# Patient Record
Sex: Male | Born: 1965
Health system: Southern US, Community
[De-identification: ages and names within clinical notes are randomized; demographics above are authoritative.]

## PROBLEM LIST (undated history)

## (undated) DIAGNOSIS — R001 Bradycardia, unspecified: Secondary | ICD-10-CM

## (undated) DIAGNOSIS — I471 Supraventricular tachycardia, unspecified: Secondary | ICD-10-CM

## (undated) DIAGNOSIS — I10 Essential (primary) hypertension: Secondary | ICD-10-CM

## (undated) DIAGNOSIS — N182 Chronic kidney disease, stage 2 (mild): Secondary | ICD-10-CM

## (undated) DIAGNOSIS — T7840XA Allergy, unspecified, initial encounter: Secondary | ICD-10-CM

## (undated) DIAGNOSIS — K219 Gastro-esophageal reflux disease without esophagitis: Secondary | ICD-10-CM

## (undated) DIAGNOSIS — E785 Hyperlipidemia, unspecified: Secondary | ICD-10-CM

## (undated) DIAGNOSIS — H269 Unspecified cataract: Secondary | ICD-10-CM

## (undated) DIAGNOSIS — G4730 Sleep apnea, unspecified: Secondary | ICD-10-CM

## (undated) HISTORY — PX: APPENDECTOMY: SHX54

## (undated) HISTORY — DX: Unspecified cataract: H26.9

## (undated) HISTORY — DX: Supraventricular tachycardia, unspecified: I47.10

## (undated) HISTORY — DX: Hyperlipidemia, unspecified: E78.5

## (undated) HISTORY — DX: Sleep apnea, unspecified: G47.30

## (undated) HISTORY — DX: Gastro-esophageal reflux disease without esophagitis: K21.9

## (undated) HISTORY — DX: Bradycardia, unspecified: R00.1

## (undated) HISTORY — DX: Supraventricular tachycardia: I47.1

## (undated) HISTORY — DX: Allergy, unspecified, initial encounter: T78.40XA

## (undated) HISTORY — DX: Essential (primary) hypertension: I10

## (undated) HISTORY — DX: Chronic kidney disease, stage 2 (mild): N18.2

---

## 1998-08-04 ENCOUNTER — Encounter (INDEPENDENT_AMBULATORY_CARE_PROVIDER_SITE_OTHER): Payer: Self-pay | Admitting: Specialist

## 1998-08-04 ENCOUNTER — Observation Stay (HOSPITAL_COMMUNITY): Admission: EM | Admit: 1998-08-04 | Discharge: 1998-08-06 | Payer: Self-pay | Admitting: Emergency Medicine

## 1998-08-04 ENCOUNTER — Encounter: Payer: Self-pay | Admitting: Emergency Medicine

## 2000-07-14 ENCOUNTER — Ambulatory Visit (HOSPITAL_COMMUNITY): Admission: RE | Admit: 2000-07-14 | Discharge: 2000-07-14 | Payer: Self-pay | Admitting: Gastroenterology

## 2007-01-14 ENCOUNTER — Emergency Department (HOSPITAL_COMMUNITY): Admission: EM | Admit: 2007-01-14 | Discharge: 2007-01-15 | Payer: Self-pay | Admitting: Emergency Medicine

## 2010-06-27 NOTE — Procedures (Signed)
[place]  Patient:    Spencer Thomas, Spencer Thomas                         MRN: [id number] Proc. Date: 07/14/00 Adm. Date:  [id number] Attending:  Charna Elizabeth CC:         Velna Hatchet, M.D.   Procedure Report  DATE OF BIRTH:  March 26, 1955  REFERRING PHYSICIAN:  Velna Hatchet, M.D.  PROCEDURE PERFORMED:  Colonoscopy.  ENDOSCOPIST:  Anselmo Rod, M.D.  INSTRUMENT USED:  Olympus video colonoscope.  INDICATIONS FOR PROCEDURE:  The patient is a 45 year old African-American male with a history of rectal bleeding, rule out colonic polyps, masses, hemorrhoids, etc.  PREPROCEDURE PREPARATION:  Informed consent was procured from the patient. The patient was fasted for eight hours prior to the procedure and prepped with a bottle of magnesium citrate and a gallon of NuLytely the night prior to the procedure.  PREPROCEDURE PHYSICAL:  The patient had stable vital signs.  Neck supple. Chest clear to auscultation.  S1, S2 regular.  Abdomen soft with normal abdominal bowel sounds.  DESCRIPTION OF PROCEDURE:  The patient was placed in the left lateral decubitus position and sedated with 70 mg of Demerol and 7 mg of Versed intravenously.  Once the patient was adequately sedated and maintained on low-flow oxygen and continuous cardiac monitoring, the Olympus video colonoscope was advanced from the rectum to the cecum without difficulty.  No masses, polyps, erosions, ulcerations, or diverticula were seen.  A small amount of residual stool in the colon, small lesions could have been missed. The procedure was complete up to the cecum.  The ileocecal valve and the appendicular orifice were clearly visualized.  There was no evidence of hemorrhoids on retroflexion in the rectum.  IMPRESSION: 1. I suspect the patient have have had rectal bleeding from hard stool. 2. A high fiber diet has been recommended with plans for outpatient follow-up    in the next two  weeks. DD:  07/14/00 TD:  07/14/00 Job: [id number]

## 2014-07-06 DIAGNOSIS — G8929 Other chronic pain: Secondary | ICD-10-CM | POA: Insufficient documentation

## 2014-07-10 DIAGNOSIS — E559 Vitamin D deficiency, unspecified: Secondary | ICD-10-CM | POA: Insufficient documentation

## 2016-09-04 ENCOUNTER — Other Ambulatory Visit (INDEPENDENT_AMBULATORY_CARE_PROVIDER_SITE_OTHER): Payer: Self-pay | Admitting: Family

## 2016-09-04 MED ORDER — CEPHALEXIN 500 MG PO CAPS: 500.0000 mg | ORAL_CAPSULE | Freq: Three times a day (TID) | ORAL | 0 refills | Status: DC

## 2018-08-29 ENCOUNTER — Other Ambulatory Visit: Payer: Self-pay | Admitting: Family Medicine

## 2018-08-29 MED ORDER — TRIAMCINOLONE ACETONIDE 0.1 % EX CREA: 1.0000 "application " | TOPICAL_CREAM | Freq: Two times a day (BID) | CUTANEOUS | 0 refills | Status: DC

## 2019-11-15 DIAGNOSIS — I499 Cardiac arrhythmia, unspecified: Secondary | ICD-10-CM | POA: Insufficient documentation

## 2019-12-20 DIAGNOSIS — R030 Elevated blood-pressure reading, without diagnosis of hypertension: Secondary | ICD-10-CM | POA: Insufficient documentation

## 2019-12-20 DIAGNOSIS — R002 Palpitations: Secondary | ICD-10-CM | POA: Insufficient documentation

## 2019-12-20 DIAGNOSIS — I499 Cardiac arrhythmia, unspecified: Secondary | ICD-10-CM | POA: Insufficient documentation

## 2019-12-20 DIAGNOSIS — E785 Hyperlipidemia, unspecified: Secondary | ICD-10-CM | POA: Insufficient documentation

## 2020-02-04 ENCOUNTER — Other Ambulatory Visit: Payer: Self-pay

## 2020-02-04 ENCOUNTER — Encounter (HOSPITAL_BASED_OUTPATIENT_CLINIC_OR_DEPARTMENT_OTHER): Payer: Self-pay | Admitting: Emergency Medicine

## 2020-02-04 ENCOUNTER — Emergency Department (HOSPITAL_BASED_OUTPATIENT_CLINIC_OR_DEPARTMENT_OTHER)
Admission: EM | Admit: 2020-02-04 | Discharge: 2020-02-04 | Disposition: A | Discharge status: Home / Self Care | Payer: Self-pay | Attending: Emergency Medicine | Admitting: Emergency Medicine

## 2020-02-04 ENCOUNTER — Emergency Department (HOSPITAL_BASED_OUTPATIENT_CLINIC_OR_DEPARTMENT_OTHER): Payer: Self-pay

## 2020-02-04 DIAGNOSIS — R0789 Other chest pain: Secondary | ICD-10-CM | POA: Insufficient documentation

## 2020-02-04 DIAGNOSIS — R079 Chest pain, unspecified: Secondary | ICD-10-CM

## 2020-02-04 LAB — CBC
HCT: 46.3 % (ref 39.0–52.0)
Hemoglobin: 16.2 g/dL (ref 13.0–17.0)
MCH: 29.9 pg (ref 26.0–34.0)
MCHC: 35 g/dL (ref 30.0–36.0)
MCV: 85.6 fL (ref 80.0–100.0)
Platelets: 230 10*3/uL (ref 150–400)
RBC: 5.41 MIL/uL (ref 4.22–5.81)
RDW: 13.6 % (ref 11.5–15.5)
WBC: 4.7 10*3/uL (ref 4.0–10.5)
nRBC: 0 % (ref 0.0–0.2)

## 2020-02-04 LAB — BASIC METABOLIC PANEL
Anion gap: 9 (ref 5–15)
BUN: 10 mg/dL (ref 6–20)
CO2: 24 mmol/L (ref 22–32)
Calcium: 9.3 mg/dL (ref 8.9–10.3)
Chloride: 106 mmol/L (ref 98–111)
Creatinine, Ser: 1.3 mg/dL — ABNORMAL HIGH (ref 0.61–1.24)
GFR, Estimated: >60 mL/min (ref >60)
Glucose, Bld: 100 mg/dL — ABNORMAL HIGH (ref 70–99)
Potassium: 3.8 mmol/L (ref 3.5–5.1)
Sodium: 139 mmol/L (ref 135–145)

## 2020-02-04 LAB — TROPONIN I (HIGH SENSITIVITY)
Troponin I (High Sensitivity): 3 ng/L (ref <18)
Troponin I (High Sensitivity): 3 ng/L (ref <18)

## 2020-02-04 MED ORDER — PANTOPRAZOLE SODIUM 20 MG PO TBEC: 20.0000 mg | DELAYED_RELEASE_TABLET | Freq: Two times a day (BID) | ORAL | 0 refills | Status: DC

## 2020-02-04 MED ORDER — NAPROXEN 375 MG PO TABS: 375.0000 mg | ORAL_TABLET | Freq: Two times a day (BID) | ORAL | 0 refills | Status: DC

## 2020-02-04 NOTE — ED Provider Notes (Signed)
[facility] EMERGENCY DEPARTMENT Provider Note   CSN: [id number] Arrival date & time: 02/04/20  1126     History Chief Complaint  Patient presents with  . Chest Pain    Spencer Thomas is a 54 y.o. male.  HPI  HPI: A 54 year old patient with a history of hypercholesterolemia presents for evaluation of chest pain. Initial onset of pain was more than 6 hours ago. The patient's chest pain is described as heaviness/pressure/tightness and is not worse with exertion. The patient's chest pain is middle- or left-sided, is not well-localized, is not sharp and does radiate to the arms/jaw/neck. The patient does not complain of nausea and denies diaphoresis. The patient has no history of stroke, has no history of peripheral artery disease, has not smoked in the past 90 days, denies any history of treated diabetes, has no relevant family history of coronary artery disease (first degree relative at less than age 52), is not hypertensive and does not have an elevated BMI (>=30).   History reviewed. No pertinent past medical history.  There are no problems to display for this patient.   Past Surgical History:  Procedure Laterality Date  . APPENDECTOMY         No family history on file.  Social History   Tobacco Use  . Smoking status: Never Smoker  . Smokeless tobacco: Never Used  Vaping Use  . Vaping Use: Never used  Substance Use Topics  . Alcohol use: Not Currently  . Drug use: Never    Home Medications Prior to Admission medications   Medication Sig Start Date End Date Taking? Authorizing Provider  cephALEXin (KEFLEX) 500 MG capsule Take 1 capsule (500 mg total) by mouth 3 (three) times daily. 09/04/16   Adonis Huguenin, NP  naproxen (NAPROSYN) 375 MG tablet Take 1 tablet (375 mg total) by mouth 2 (two) times daily. 02/04/20   Linwood Dibbles, MD  pantoprazole (PROTONIX) 20 MG tablet Take 1 tablet (20 mg total) by mouth 2 (two) times daily. 02/04/20   Linwood Dibbles, MD   triamcinolone cream (KENALOG) 0.1 % Apply 1 application topically 2 (two) times daily. 08/29/18   Hilts, Casimiro Needle, MD    Allergies    Patient has no known allergies.  Review of Systems   Review of Systems  All other systems reviewed and are negative.   Physical Exam Updated Vital Signs BP (!) 127/99 (BP Location: Right Arm)   Pulse 69   Temp (!) 97.3 F (36.3 C) (Oral)   Resp 16   Ht 1.791 m (5' 10.5")   Wt 117.9 kg   SpO2 100%   BMI 36.78 kg/m   Physical Exam Vitals and nursing note reviewed.  Constitutional:      General: He is not in acute distress.    Appearance: He is well-developed and well-nourished.  HENT:     Head: Normocephalic and atraumatic.     Right Ear: External ear normal.     Left Ear: External ear normal.  Eyes:     General: No scleral icterus.       Right eye: No discharge.        Left eye: No discharge.     Conjunctiva/sclera: Conjunctivae normal.  Neck:     Trachea: No tracheal deviation.  Cardiovascular:     Rate and Rhythm: Normal rate and regular rhythm.     Pulses: Intact distal pulses.  Pulmonary:     Effort: Pulmonary effort is normal. No respiratory distress.  Breath sounds: Normal breath sounds. No stridor. No wheezing or rales.  Abdominal:     General: Bowel sounds are normal. There is no distension.     Palpations: Abdomen is soft.     Tenderness: There is no abdominal tenderness. There is no guarding or rebound.  Musculoskeletal:        General: No tenderness or edema.     Cervical back: Neck supple.  Skin:    General: Skin is warm and dry.     Findings: No rash.  Neurological:     Mental Status: He is alert.     Cranial Nerves: No cranial nerve deficit (no facial droop, extraocular movements intact, no slurred speech).     Sensory: No sensory deficit.     Motor: No abnormal muscle tone or seizure activity.     Coordination: Coordination normal.     Deep Tendon Reflexes: Strength normal.  Psychiatric:        Mood and  Affect: Mood and affect normal.     ED Results / Procedures / Treatments   Labs (all labs ordered are listed, but only abnormal results are displayed) Labs Reviewed  BASIC METABOLIC PANEL - Abnormal; Notable for the following components:      Result Value   Glucose, Bld 100 (*)    Creatinine, Ser 1.30 (*)    All other components within normal limits  CBC  TROPONIN I (HIGH SENSITIVITY)  TROPONIN I (HIGH SENSITIVITY)    EKG EKG Interpretation  Date/Time:  Sunday February 04 2020 11:25:37 EST Ventricular Rate:  80 PR Interval:  174 QRS Duration: 104 QT Interval:  392 QTC Calculation: 452 R Axis:   -23 Text Interpretation: Normal sinus rhythm Minimal voltage criteria for LVH, may be normal variant ( Cornell product ) Borderline ECG No old tracing to compare Confirmed by Linwood Dibbles [phone]) on 02/04/2020 11:46:55 AM   Radiology DG Chest 2 View  Result Date: 02/04/2020 CLINICAL DATA:  Chest pain EXAM: CHEST - 2 VIEW COMPARISON:  None FINDINGS: Normal heart size, mediastinal contours, and pulmonary vascularity. Lungs clear. No pleural effusion or pneumothorax. Bones unremarkable. IMPRESSION: Normal exam. Electronically Signed   By: Ulyses Southward M.D.   On: 02/04/2020 11:51    Procedures Procedures (including critical care time)  Medications Ordered in ED Medications - No data to display  ED Course  I have reviewed the triage vital signs and the nursing notes.  Pertinent labs & imaging results that were available during my care of the patient were reviewed by me and considered in my medical decision making (see chart for details).    MDM Rules/Calculators/A&P HEAR Score: 4                        Patient presented to ED for evaluation of chest pain.  Symptoms constant and ongoing since yesterday.  Moderate risk heart score.  Serial troponins normal.  I doubt symptoms related to acute coronary syndrome.  Patient without shortness of breath.  No tachycardia.  Doubt  PE.  Etiology of symptoms unclear.  Will try course of antacids as this could be related to acid reflux.  Discussed outpatient follow-up with PCP.  Warning signs precautions discussed. Final Clinical Impression(s) / ED Diagnoses Final diagnoses:  Chest pain, unspecified type    Rx / DC Orders ED Discharge Orders         Ordered    pantoprazole (PROTONIX) 20 MG tablet  2 times daily  02/04/20 1540    naproxen (NAPROSYN) 375 MG tablet  2 times daily        02/04/20 1540           Linwood Dibbles, MD 02/04/20 1541

## 2020-02-04 NOTE — Discharge Instructions (Addendum)
Take the medications as prescribed.  Follow-up with your primary care doctor to be rechecked as we discussed.  Return as needed for worsening symptoms.

## 2020-02-04 NOTE — ED Notes (Signed)
Yesterday began having chest pain, mid chest, non-radiating, denies nausea or vomiting, states he awoke with mid chest pain, considered it being muscular pain. States he does not feel like he has had any shortness of breath, contributes that to wearing a mask

## 2020-02-04 NOTE — ED Triage Notes (Signed)
Pt c/o non-radiating left sided chest pain onset yesterday.

## 2020-02-04 NOTE — ED Notes (Signed)
ED Provider at bedside. 

## 2020-03-01 ENCOUNTER — Encounter (HOSPITAL_COMMUNITY): Payer: Self-pay | Admitting: Emergency Medicine

## 2020-03-01 ENCOUNTER — Emergency Department (HOSPITAL_COMMUNITY): Payer: 59

## 2020-03-01 ENCOUNTER — Other Ambulatory Visit: Payer: Self-pay

## 2020-03-01 ENCOUNTER — Inpatient Hospital Stay (HOSPITAL_COMMUNITY)
Admission: EM | Admit: 2020-03-01 | Discharge: 2020-03-04 | DRG: 287 | Disposition: A | Discharge status: Home / Self Care | Payer: 59 | Attending: Internal Medicine | Admitting: Internal Medicine

## 2020-03-01 DIAGNOSIS — Z20822 Contact with and (suspected) exposure to covid-19: Secondary | ICD-10-CM | POA: Diagnosis not present

## 2020-03-01 DIAGNOSIS — E669 Obesity, unspecified: Secondary | ICD-10-CM | POA: Diagnosis present

## 2020-03-01 DIAGNOSIS — I208 Other forms of angina pectoris: Secondary | ICD-10-CM | POA: Diagnosis not present

## 2020-03-01 DIAGNOSIS — I209 Angina pectoris, unspecified: Secondary | ICD-10-CM | POA: Diagnosis not present

## 2020-03-01 DIAGNOSIS — Z7982 Long term (current) use of aspirin: Secondary | ICD-10-CM

## 2020-03-01 DIAGNOSIS — Z79899 Other long term (current) drug therapy: Secondary | ICD-10-CM

## 2020-03-01 DIAGNOSIS — I471 Supraventricular tachycardia: Secondary | ICD-10-CM | POA: Diagnosis not present

## 2020-03-01 DIAGNOSIS — I10 Essential (primary) hypertension: Secondary | ICD-10-CM

## 2020-03-01 DIAGNOSIS — Z6837 Body mass index (BMI) 37.0-37.9, adult: Secondary | ICD-10-CM | POA: Diagnosis not present

## 2020-03-01 DIAGNOSIS — R079 Chest pain, unspecified: Secondary | ICD-10-CM | POA: Diagnosis present

## 2020-03-01 DIAGNOSIS — R0789 Other chest pain: Secondary | ICD-10-CM | POA: Diagnosis not present

## 2020-03-01 DIAGNOSIS — E785 Hyperlipidemia, unspecified: Secondary | ICD-10-CM | POA: Diagnosis not present

## 2020-03-01 DIAGNOSIS — R9431 Abnormal electrocardiogram [ECG] [EKG]: Secondary | ICD-10-CM

## 2020-03-01 DIAGNOSIS — E7800 Pure hypercholesterolemia, unspecified: Secondary | ICD-10-CM | POA: Diagnosis not present

## 2020-03-01 LAB — TROPONIN I (HIGH SENSITIVITY)
Troponin I (High Sensitivity): 3 ng/L (ref <18)
Troponin I (High Sensitivity): 3 ng/L (ref <18)

## 2020-03-01 LAB — CBC
HCT: 47.3 % (ref 39.0–52.0)
Hemoglobin: 15.8 g/dL (ref 13.0–17.0)
MCH: 29.2 pg (ref 26.0–34.0)
MCHC: 33.4 g/dL (ref 30.0–36.0)
MCV: 87.3 fL (ref 80.0–100.0)
Platelets: 212 10*3/uL (ref 150–400)
RBC: 5.42 MIL/uL (ref 4.22–5.81)
RDW: 13.5 % (ref 11.5–15.5)
WBC: 5.8 10*3/uL (ref 4.0–10.5)
nRBC: 0 % (ref 0.0–0.2)

## 2020-03-01 LAB — BASIC METABOLIC PANEL
Anion gap: 12 (ref 5–15)
BUN: 16 mg/dL (ref 6–20)
CO2: 26 mmol/L (ref 22–32)
Calcium: 9.1 mg/dL (ref 8.9–10.3)
Chloride: 103 mmol/L (ref 98–111)
Creatinine, Ser: 1.23 mg/dL (ref 0.61–1.24)
GFR, Estimated: >60 mL/min (ref >60)
Glucose, Bld: 95 mg/dL (ref 70–99)
Potassium: 4.5 mmol/L (ref 3.5–5.1)
Sodium: 141 mmol/L (ref 135–145)

## 2020-03-01 MED ORDER — ONDANSETRON HCL 4 MG/2ML IJ SOLN: 4.0000 mg | Freq: Four times a day (QID) | INTRAMUSCULAR | Status: DC | PRN

## 2020-03-01 MED ORDER — NITROGLYCERIN 0.4 MG SL SUBL: 0.4000 mg | SUBLINGUAL_TABLET | SUBLINGUAL | Status: DC | PRN

## 2020-03-01 MED ORDER — HEPARIN BOLUS VIA INFUSION
4000.0000 [IU] | Freq: Once | INTRAVENOUS | Status: AC
  Administered 2020-03-01: 4000 [IU] via INTRAVENOUS
  Filled 2020-03-01: qty 4000

## 2020-03-01 MED ORDER — ATORVASTATIN CALCIUM 80 MG PO TABS
80.0000 mg | ORAL_TABLET | Freq: Every day | ORAL | Status: DC
  Administered 2020-03-02 – 2020-03-03 (×2): 80 mg via ORAL
  Filled 2020-03-01 (×2): qty 1

## 2020-03-01 MED ORDER — NITROGLYCERIN 0.4 MG SL SUBL
0.4000 mg | SUBLINGUAL_TABLET | SUBLINGUAL | Status: DC | PRN
  Administered 2020-03-01: 0.4 mg via SUBLINGUAL
  Filled 2020-03-01: qty 1

## 2020-03-01 MED ORDER — METOPROLOL TARTRATE 12.5 MG HALF TABLET
12.5000 mg | ORAL_TABLET | Freq: Two times a day (BID) | ORAL | Status: DC
  Administered 2020-03-02 – 2020-03-04 (×3): 12.5 mg via ORAL
  Filled 2020-03-01 (×6): qty 1

## 2020-03-01 MED ORDER — ASPIRIN EC 81 MG PO TBEC
81.0000 mg | DELAYED_RELEASE_TABLET | Freq: Every day | ORAL | Status: DC
  Administered 2020-03-02 – 2020-03-04 (×3): 81 mg via ORAL
  Filled 2020-03-01 (×3): qty 1

## 2020-03-01 MED ORDER — ACETAMINOPHEN 325 MG PO TABS: 650.0000 mg | ORAL_TABLET | ORAL | Status: DC | PRN

## 2020-03-01 MED ORDER — LOSARTAN POTASSIUM 25 MG PO TABS
12.5000 mg | ORAL_TABLET | Freq: Every day | ORAL | Status: DC
  Administered 2020-03-02: 12.5 mg via ORAL
  Filled 2020-03-01: qty 1

## 2020-03-01 MED ORDER — HEPARIN (PORCINE) 25000 UT/250ML-% IV SOLN
1600.0000 [IU]/h | INTRAVENOUS | Status: DC
  Administered 2020-03-01: 23:00:00 1200 [IU]/h via INTRAVENOUS
  Administered 2020-03-02 – 2020-03-03 (×2): 1300 [IU]/h via INTRAVENOUS
  Administered 2020-03-04: 1600 [IU]/h via INTRAVENOUS
  Filled 2020-03-01 (×6): qty 250

## 2020-03-01 NOTE — H&P (Signed)
Cardiology History & Physical    Patient ID: TANYON ALIPIO MRN: [id number], DOB/AGE: 07/04/1965   Admit date: 03/01/2020  Primary Physician: Maudie Flakes, FNP Primary Cardiologist: No primary care provider on file.  Patient Profile    55 year old male with history of hypertension and obesity who presents for chest pain.  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  He describes central and left-sided chest pain that has a stabbing-like sensation, but also some aspects of pressure-like sensation that radiates down his arm and his worsened by any sort of exertion.  He says that even sitting up will make it worse.  Ambulating certainly makes it worse.  Initially had chest pain that was about 8 out of 10 and received nitroglycerin which improved to 2 out of 10.  EMS administered aspirin in route.  At this point he is relatively chest pain-free but will have some chest pain with any sort of maneuvering or positional changes.  He cannot identify specific movement that worsens the pain and he does not feel like it is musculoskeletal.  Sitting up does not help it but rather worsens it.  Lying down does not worsen it.  It is not clearly related to respiration.  He has no prior cardiovascular history.  Serial troponin 3 and 4:03 hours in the waiting room.  ECG reviewed with some lateral ST segment elevation, not meeting criteria, with nonspecific ST changes and abnormal axis.  This is apparently similar to an ECG that was obtained earlier this year on review of care everywhere records.  Given a similar episode earlier this year you have been referred to an outpatient cardiologist appointment and with the [facility] syste and recommended for  a stress echocardiogram, but he did not complete this because he did not transition to [facility] and was out of insurance for a bit.  He did okay until around December at the holidays when he had a very similar episode that resolved uneventfully but was not nearly as intense in terms of the pain.  He thinks that he has been having some shortness of breath just doing his normal activities of work in the intervening time between Christmas and now.  No orthopnea, PND.  Occasional fluttering in the chest and palpitations.  Past Medical History   History reviewed. No pertinent past medical history.  Past Surgical History:  Procedure Laterality Date   APPENDECTOMY       Allergies No Known Allergies  Home Medications    Prior to Admission medications   Medication Sig Start Date End Date Taking? Authorizing Provider  cephALEXin (KEFLEX) 500 MG capsule Take 1 capsule (500 mg total) by mouth 3 (three) times daily. 09/04/16   Adonis Huguenin, NP  naproxen (NAPROSYN) 375 MG tablet Take 1 tablet (375 mg total) by mouth 2 (two) times daily. 02/04/20   Linwood Dibbles, MD  pantoprazole (PROTONIX) 20 MG tablet Take 1 tablet (20 mg total) by mouth 2 (two) times daily. 02/04/20   Linwood Dibbles, MD  triamcinolone cream (KENALOG) 0.1 % Apply 1 application topically 2 (two) times daily. 08/29/18   Hilts, Casimiro Needle, MD    Family History    Mother and brother with diabetes  but no significant family history of premature ASCVD.  Social History    Non-smoker, rare alcohol use, no sympathomimetic abuse.  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  Exercise pretty regularly prior to the pandemic but has slacked off at this point.  Review of Systems    General:  No chills, fever, night sweats or weight changes.  Cardiovascular:  No chest pain, dyspnea on exertion, edema, orthopnea, palpitations, paroxysmal nocturnal dyspnea. Dermatological: No rash, lesions/masses Respiratory: No cough,  dyspnea Urologic: No hematuria, dysuria Abdominal:   No nausea, vomiting, diarrhea, bright red blood per rectum, melena, or hematemesis Neurologic:  No visual changes, wkns, changes in mental status. All other systems reviewed and are otherwise negative except as noted above.  Physical Exam    BP 126/85    Pulse (!) 59    Temp 98.1 F (36.7 C) (Oral)    Resp 18    Ht 5' 10.5" (1.791 m)    Wt 120.2 kg    SpO2 95%    BMI 37.49 kg/m  General: Alert, NAD HEENT: Normal  Neck: No bruits or JVD. Lungs:  Resp regular and unlabored, CTA bilaterally. Heart: Regular rhythm, no s3, s4, or murmurs. Abdomen: Soft, non-tender, non-distended, BS +.  Extremities: Warm. No clubbing, cyanosis or edema. DP/PT/Radials 2+ and equal bilaterally. Psych: Normal affect. Neuro: Alert and oriented. No gross focal deficits. No abnormal movements.  Labs  I have reviewed all the objective data personally, summarized as below.  Chemistries unremarkable High-sensitivity troponin 3, 3 CBC unremarkable   Radiology Studies    DG Chest 2 View  Result Date: 03/01/2020 CLINICAL DATA:  Chest pain. EXAM: CHEST - 2 VIEW COMPARISON:  February 04, 2020. FINDINGS: The heart size and mediastinal contours are within normal limits. Both lungs are clear. No pneumothorax or pleural effusion is noted. The visualized skeletal structures are unremarkable. IMPRESSION: No active cardiopulmonary disease. Electronically Signed   By: Lupita Raider M.D.   On: 03/01/2020 16:42   DG Chest 2 View  Result Date: 02/04/2020 CLINICAL DATA:  Chest pain EXAM: CHEST - 2 VIEW COMPARISON:  None FINDINGS: Normal heart size, mediastinal contours, and pulmonary vascularity. Lungs clear. No pleural effusion or pneumothorax. Bones unremarkable. IMPRESSION: Normal exam. Electronically Signed   By: Ulyses Southward M.D.   On: 02/04/2020 11:51    ECG & Cardiac Imaging    ECG personally reviewed.  Borderline LVH by aVL criteria.  Repolarization  abnormalities in the lateral leads.  Poor R wave progression with nonspecific ST changes across the precordium.  Assessment & Plan    54 year old male with a history of hypertension, untreated, and obesity who presents with chest pain.  The chest pain has some typical features including worsening with exertion and improvement with rest with some response to nitroglycerin.  Given persistent symptoms and abnormal ECG I am concerned for an unstable angina picture.  Additionally, it sounds like he has had some worsening symptoms over the last month with a similar episode at the end of December and some subsequent exertional shortness of breath.  ECG has some repolarization abnormalities that are nonspecific.  I discussed with him that it is a bit abnormal to be having a true acute coronary syndrome with negative high-sensitivity troponin, but he agreed that he would prefer to be on the safe side with a more aggressive treatment course, and I think this is reasonable given his age and risk factors.  Problem list Chest pain  Abnormal EKG Hypertension Obesity  Plan #Chest pain #Abnormal EKG -Aspirin 81 mg -Heparin infusion, ACS nomogram -High intensity statin -Restratification labs in the morning -Pending clinical course and test availability, CTA versus invasive angiography this admission -Nitroglycerin as needed  #Hypertension -Start losartan 12.5 mg daily  #Obesity -Lifestyle modification discussed.  Nutrition: Regular diet DVT ppx: Therapeutic anticoagulation GI ppx: Not indicated Advanced Care Planning: Full code   Signed, Regino Schultze, MD 03/01/2020, 10:32 PM

## 2020-03-01 NOTE — ED Notes (Signed)
Admitting at bedside 

## 2020-03-01 NOTE — ED Triage Notes (Signed)
Patient BIB [facility] for chest pressure that started while at work. Reported pain initially was 10/10, decreased to 4/10 without intervention. Patient was concerned because he was told to see a cardiologist but has not been able to see one yet.  324mg  aspirin  0.4 mg 1x SL NTG 20g saline lock in left [facility]

## 2020-03-01 NOTE — ED Notes (Signed)
Pt ambulatory to bathroom, steady gait

## 2020-03-01 NOTE — ED Provider Notes (Signed)
[facility] EMERGENCY DEPARTMENT Provider Note   CSN: [id number] Arrival date & time: 03/01/20  1610     History Chief Complaint  Patient presents with  . Chest Pain    CAPRI RABEN is a 55 y.o. male.  The history is provided by the patient.  Chest Pain Pain location:  L chest Pain quality: aching, pressure and tightness   Pain radiates to:  L jaw, L shoulder and L arm Pain severity:  Moderate Onset quality:  Sudden Duration: severe pain lasted about but now has been intermittent dull pain since. Timing:  Intermittent Progression:  Waxing and waning Chronicity: 1 prior episode 12/26. Context comment:  Started while working today but minimal exertion Relieved by: significant improvement with ASA and NTG. Worsened by:  Exertion and certain positions Ineffective treatments:  None tried Associated symptoms: no abdominal pain, no back pain, no cough, no diaphoresis, no dizziness, no fever, no headache, no heartburn, no lower extremity edema, no nausea, no near-syncope, no shortness of breath, no syncope, no vomiting and no weakness   Risk factors: male sex   Risk factors: no coronary artery disease, no diabetes mellitus, no prior DVT/PE and no smoking   Risk factors comment:  Hx of untreated HTN, HLD.  no family hx of CAD or PE/DVT      History reviewed. No pertinent past medical history.  There are no problems to display for this patient.   Past Surgical History:  Procedure Laterality Date  . APPENDECTOMY         No family history on file.  Social History   Tobacco Use  . Smoking status: Never Smoker  . Smokeless tobacco: Never Used  Vaping Use  . Vaping Use: Never used  Substance Use Topics  . Alcohol use: Not Currently  . Drug use: Never    Home Medications Prior to Admission medications   Medication Sig Start Date End Date Taking? Authorizing Provider  cephALEXin (KEFLEX) 500 MG capsule Take 1 capsule (500 mg total) by mouth 3  (three) times daily. 09/04/16   Adonis Huguenin, NP  naproxen (NAPROSYN) 375 MG tablet Take 1 tablet (375 mg total) by mouth 2 (two) times daily. 02/04/20   Linwood Dibbles, MD  pantoprazole (PROTONIX) 20 MG tablet Take 1 tablet (20 mg total) by mouth 2 (two) times daily. 02/04/20   Linwood Dibbles, MD  triamcinolone cream (KENALOG) 0.1 % Apply 1 application topically 2 (two) times daily. 08/29/18   Hilts, Casimiro Needle, MD    Allergies    Patient has no known allergies.  Review of Systems   Review of Systems  Constitutional: Negative for diaphoresis and fever.  Respiratory: Negative for cough and shortness of breath.   Cardiovascular: Positive for chest pain. Negative for syncope and near-syncope.  Gastrointestinal: Negative for abdominal pain, heartburn, nausea and vomiting.  Musculoskeletal: Negative for back pain.  Neurological: Negative for dizziness, weakness and headaches.  All other systems reviewed and are negative.   Physical Exam Updated Vital Signs BP (!) 153/96   Pulse 66   Temp 98.1 F (36.7 C) (Oral)   Resp (!) 24   Ht 5' 10.5" (1.791 m)   Wt 120.2 kg   SpO2 100%   BMI 37.49 kg/m   Physical Exam Vitals and nursing note reviewed.  Constitutional:      General: He is not in acute distress.    Appearance: He is well-developed and well-nourished.  HENT:     Head: Normocephalic and  atraumatic.     Mouth/Throat:     Mouth: Oropharynx is clear and moist.  Eyes:     Extraocular Movements: EOM normal.     Conjunctiva/sclera: Conjunctivae normal.     Pupils: Pupils are equal, round, and reactive to light.  Cardiovascular:     Rate and Rhythm: Normal rate and regular rhythm.     Pulses: Normal pulses and intact distal pulses.     Heart sounds: Normal heart sounds. No murmur heard.   Pulmonary:     Effort: Pulmonary effort is normal. No respiratory distress.     Breath sounds: Normal breath sounds. No wheezing or rales.     Comments: Pain reproduced with pt sitting up and  moving around in the bed but not with palpation Chest:     Chest wall: No tenderness.  Abdominal:     General: There is no distension.     Palpations: Abdomen is soft.     Tenderness: There is no abdominal tenderness. There is no guarding or rebound.  Musculoskeletal:        General: No tenderness or edema. Normal range of motion.     Cervical back: Normal range of motion and neck supple.     Right lower leg: No edema.     Left lower leg: No edema.  Skin:    General: Skin is warm and dry.     Findings: No erythema or rash.  Neurological:     General: No focal deficit present.     Mental Status: He is alert and oriented to person, place, and time. Mental status is at baseline.  Psychiatric:        Mood and Affect: Mood and affect and mood normal.        Behavior: Behavior normal.        Thought Content: Thought content normal.     ED Results / Procedures / Treatments   Labs (all labs ordered are listed, but only abnormal results are displayed) Labs Reviewed  BASIC METABOLIC PANEL  CBC  TROPONIN I (HIGH SENSITIVITY)  TROPONIN I (HIGH SENSITIVITY)    EKG EKG Interpretation  Date/Time:  Friday March 01 2020 16:08:02 EST Ventricular Rate:  84 PR Interval:  168 QRS Duration: 92 QT Interval:  392 QTC Calculation: 463 R Axis:   -17 Text Interpretation: Normal sinus rhythm Minimal voltage criteria for LVH, may be normal variant ( R in aVL ) Nonspecific T wave abnormality new Prolonged QT Confirmed by Gwyneth Sprout ([id number]) on 03/01/2020 7:38:22 PM   Radiology DG Chest 2 View  Result Date: 03/01/2020 CLINICAL DATA:  Chest pain. EXAM: CHEST - 2 VIEW COMPARISON:  February 04, 2020. FINDINGS: The heart size and mediastinal contours are within normal limits. Both lungs are clear. No pneumothorax or pleural effusion is noted. The visualized skeletal structures are unremarkable. IMPRESSION: No active cardiopulmonary disease. Electronically Signed   By: Lupita Raider M.D.    On: 03/01/2020 16:42    Procedures Procedures (including critical care time)  Medications Ordered in ED Medications  nitroGLYCERIN (NITROSTAT) SL tablet 0.4 mg (has no administration in time range)    ED Course  I have reviewed the triage vital signs and the nursing notes.  Pertinent labs & imaging results that were available during my care of the patient were reviewed by me and considered in my medical decision making (see chart for details).    MDM Rules/Calculators/A&P  Patient is a 55 year old male presenting today with chest pain that started today while he was at work.  He reports the severe pain lasted for approximately 30 minutes and almost brought him to his knees with radiation into his arm and jaw.  It improved after he received aspirin and nitroglycerin and has only been a very dull pain approximately a 2 out of 10 but he notices it does get worse anytime he does anything including changing his close or walk.  He denies any associated shortness of breath, nausea, near syncope.  He has no abdominal pain and reports symptoms did not start directly after eating.  Patient was seen in the emergency room on 02/04/2020 for chest pain that had been present for approximately 24 hours.  He reports the pain today was more severe but was in the same location as last time.  At that time patient had a nonspecific EKG and negative troponins.  He was discharged home and had plan on following up with cardiology but because of a recent job change he was waiting for his insurance before he scheduled an appointment.  Patient did see a cardiologist in November 2021 at [facility].  He saw them because he had an abnormal EKG.  Based on the cardiologist note he had untreated hyperlipidemia and hypertension.  He was supposed to be keeping a journal of his blood pressures and start on a lipid lowering medication.  Patient has not been doing that and did not start taking the cholesterol  medication.  He has been taking a baby aspirin daily and the cardiologist wanted to do an exercise stress test as well as Holter monitor placement because he was having PACs.  Patient did not have any of those test done.  Patient is a heart score of 6 which does put him at a moderate risk.  He is still complaining of some pain we will give an additional nitroglycerin.  His troponins are normal and BMP and CBC are within normal limit.  EKG does show ST depression inferiorly which is unchanged from prior EKGs.  Low suspicion for PE, dissection or infectious etiology.  Seems less likely to be GERD as his symptoms did not occur directly after eating he has no abdominal pain and is not made worse by lying down.  We will repeat an EKG check patient after a second nitroglycerin and discussed with cardiology.  9:18 PM Repeat EKG here shows some mild ST elevation in lateral leads I and aVL which were not present on initial EKG less than 2 mm.  Will discuss with cardiology.  9:35 PM No CODE STEMI at this time and pain improved after NTG but will start on heparin and Cardiology to see the pt.  MDM Number of Diagnoses or Management Options   Amount and/or Complexity of Data Reviewed Clinical lab tests: ordered and reviewed Tests in the radiology section of CPT: ordered and reviewed Tests in the medicine section of CPT: reviewed and ordered Decide to obtain previous medical records or to obtain history from someone other than the patient: yes Obtain history from someone other than the patient: yes Review and summarize past medical records: yes Discuss the patient with other providers: yes Independent visualization of images, tracings, or specimens: yes  Risk of Complications, Morbidity, and/or Mortality Presenting problems: high Diagnostic procedures: moderate Management options: moderate  Patient Progress Patient progress: improved  CRITICAL CARE Performed by: Terique Kawabata Total critical  care time: 30 minutes Critical care time was exclusive of  separately billable procedures and treating other patients. Critical care was necessary to treat or prevent imminent or life-threatening deterioration. Critical care was time spent personally by me on the following activities: development of treatment plan with patient and/or surrogate as well as nursing, discussions with consultants, evaluation of patient's response to treatment, examination of patient, obtaining history from patient or surrogate, ordering and performing treatments and interventions, ordering and review of laboratory studies, ordering and review of radiographic studies, pulse oximetry and re-evaluation of patient's condition.  Final Clinical Impression(s) / ED Diagnoses Final diagnoses:  Angina at rest [facility])    Rx / DC Orders ED Discharge Orders    None       Gwyneth Sprout, MD 03/01/20 2136

## 2020-03-01 NOTE — ED Notes (Signed)
Pt called out to say he was sweating and feeling like he was "loosing his mind for a sec".  PT was visibly diaphoretic but improving in the way that he states he feels.  VS WNL and mentation appears clear at this time.  PT is A&0 x4.

## 2020-03-01 NOTE — ED Notes (Signed)
Pt called to be roomed, no response. Pt not seen in the lobby

## 2020-03-01 NOTE — ED Notes (Signed)
Pt in the room at this time

## 2020-03-01 NOTE — Progress Notes (Signed)
ANTICOAGULATION CONSULT NOTE - Initial Consult  Pharmacy Consult for heparin Indication: chest pain/ACS  No Known Allergies  Patient Measurements: Height: 5' 10.5" (179.1 cm) Weight: 120.2 kg (265 lb) IBW/kg (Calculated) : 74.15 Heparin Dosing Weight: 100kg  Vital Signs: Temp: 98.1 F (36.7 C) (01/21 1611) Temp Source: Oral (01/21 1611) BP: 105/69 (01/21 2046) Pulse Rate: 63 (01/21 2046)  Labs: Recent Labs    03/01/20 1621 03/01/20 1923  HGB 15.8  --   HCT 47.3  --   PLT 212  --   CREATININE 1.23  --   TROPONINIHS 3 3    Estimated Creatinine Clearance: 92 mL/min (by C-G formula based on SCr of 1.23 mg/dL).   Medical History: History reviewed. No pertinent past medical history.  Assessment: 65 YOM presenting with CP, diaphoretic, not on anticoagulation PTA.  CBC wnl  Goal of Therapy:  Heparin level 0.3-0.7 units/ml Monitor platelets by anticoagulation protocol: Yes   Plan:  Heparin 4000 units IV x 1, and gtt at 1200 units/hr F/u 6 hour heparin level  Daylene Posey, PharmD Clinical Pharmacist ED Pharmacist Phone # [phone] 03/01/2020 9:54 PM

## 2020-03-02 ENCOUNTER — Other Ambulatory Visit: Payer: Self-pay

## 2020-03-02 ENCOUNTER — Inpatient Hospital Stay (HOSPITAL_COMMUNITY): Payer: 59

## 2020-03-02 ENCOUNTER — Encounter (HOSPITAL_COMMUNITY): Payer: Self-pay | Admitting: Student

## 2020-03-02 DIAGNOSIS — E7800 Pure hypercholesterolemia, unspecified: Secondary | ICD-10-CM

## 2020-03-02 DIAGNOSIS — R079 Chest pain, unspecified: Secondary | ICD-10-CM

## 2020-03-02 DIAGNOSIS — I10 Essential (primary) hypertension: Secondary | ICD-10-CM

## 2020-03-02 LAB — LIPID PANEL
Cholesterol: 243 mg/dL — ABNORMAL HIGH (ref 0–200)
HDL: 40 mg/dL — ABNORMAL LOW (ref >40)
LDL Cholesterol: 190 mg/dL — ABNORMAL HIGH (ref 0–99)
Total CHOL/HDL Ratio: 6.1 RATIO
Triglycerides: 63 mg/dL (ref <150)
VLDL: 13 mg/dL (ref 0–40)

## 2020-03-02 LAB — TSH: TSH: 1.616 u[IU]/mL (ref 0.350–4.500)

## 2020-03-02 LAB — SARS CORONAVIRUS 2 (TAT 6-24 HRS): SARS Coronavirus 2: NEGATIVE

## 2020-03-02 LAB — CBC
HCT: 44.4 % (ref 39.0–52.0)
Hemoglobin: 15 g/dL (ref 13.0–17.0)
MCH: 29.1 pg (ref 26.0–34.0)
MCHC: 33.8 g/dL (ref 30.0–36.0)
MCV: 86 fL (ref 80.0–100.0)
Platelets: 190 10*3/uL (ref 150–400)
RBC: 5.16 MIL/uL (ref 4.22–5.81)
RDW: 13.3 % (ref 11.5–15.5)
WBC: 6.3 10*3/uL (ref 4.0–10.5)
nRBC: 0 % (ref 0.0–0.2)

## 2020-03-02 LAB — ECHOCARDIOGRAM COMPLETE
Area-P 1/2: 3.37 cm2
Height: 70.5 in
S' Lateral: 3.2 cm
Single Plane A2C EF: 55.9 %
Weight: 4208.14 oz

## 2020-03-02 LAB — BASIC METABOLIC PANEL
Anion gap: 9 (ref 5–15)
BUN: 14 mg/dL (ref 6–20)
CO2: 25 mmol/L (ref 22–32)
Calcium: 8.8 mg/dL — ABNORMAL LOW (ref 8.9–10.3)
Chloride: 105 mmol/L (ref 98–111)
Creatinine, Ser: 1.14 mg/dL (ref 0.61–1.24)
GFR, Estimated: >60 mL/min (ref >60)
Glucose, Bld: 123 mg/dL — ABNORMAL HIGH (ref 70–99)
Potassium: 3.7 mmol/L (ref 3.5–5.1)
Sodium: 139 mmol/L (ref 135–145)

## 2020-03-02 LAB — HEPARIN LEVEL (UNFRACTIONATED)
Heparin Unfractionated: 0.26 IU/mL — ABNORMAL LOW (ref 0.30–0.70)
Heparin Unfractionated: 0.38 IU/mL (ref 0.30–0.70)

## 2020-03-02 LAB — HEMOGLOBIN A1C
Hgb A1c MFr Bld: 5 % (ref 4.8–5.6)
Mean Plasma Glucose: 96.8 mg/dL

## 2020-03-02 LAB — PROTIME-INR
INR: 1 (ref 0.8–1.2)
Prothrombin Time: 13.1 seconds (ref 11.4–15.2)

## 2020-03-02 LAB — HIV ANTIBODY (ROUTINE TESTING W REFLEX): HIV Screen 4th Generation wRfx: NONREACTIVE

## 2020-03-02 LAB — BRAIN NATRIURETIC PEPTIDE: B Natriuretic Peptide: 35 pg/mL (ref 0.0–100.0)

## 2020-03-02 MED ORDER — METOPROLOL TARTRATE 5 MG/5ML IV SOLN: 5.0000 mg | Freq: Once | INTRAVENOUS | Status: DC

## 2020-03-02 MED ORDER — METOPROLOL TARTRATE 100 MG PO TABS
100.0000 mg | ORAL_TABLET | Freq: Once | ORAL | Status: AC
  Administered 2020-03-02: 100 mg via ORAL
  Filled 2020-03-02: qty 1

## 2020-03-02 MED ORDER — METOPROLOL TARTRATE 5 MG/5ML IV SOLN
INTRAVENOUS | Status: AC
  Filled 2020-03-02: qty 5

## 2020-03-02 MED ORDER — IOHEXOL 350 MG/ML SOLN: 80.0000 mL | Freq: Once | INTRAVENOUS | Status: DC | PRN

## 2020-03-02 NOTE — Progress Notes (Signed)
Dr Mayford Knife informed of vital signs. Order to give 5 mg iv Lopressor now.

## 2020-03-02 NOTE — Progress Notes (Signed)
ANTICOAGULATION CONSULT NOTE - Follow Up Consult  Pharmacy Consult for Heparin Indication: chest pain/ACS  No Known Allergies  Patient Measurements: Height: 5' 10.5" (179.1 cm) Weight: 119.3 kg (263 lb 0.1 oz) IBW/kg (Calculated) : 74.15 Heparin Dosing Weight: 100.7 kg  Vital Signs: Temp: (P) 98 F (36.7 C) (01/22 0743) Temp Source: (P) Oral (01/22 0743) BP: (P) 115/80 (01/22 0743) Pulse Rate: (P) 66 (01/22 0743)  Labs: Recent Labs    03/01/20 1621 03/01/20 1923 03/02/20 0248  HGB 15.8  --  15.0  HCT 47.3  --  44.4  PLT 212  --  190  LABPROT  --   --  13.1  INR  --   --  1.0  HEPARINUNFRC  --   --  0.26*  CREATININE 1.23  --  1.14  TROPONINIHS 3 3  --     Estimated Creatinine Clearance: 98.8 mL/min (by C-G formula based on SCr of 1.14 mg/dL).   Assessment: 55 yo male presents with severe, acute onset chest pain. PTA the patient is not anticoagulation. Pharmacy is consulted to dose heparin.   Heparin level is therapeutic at 0.38 while running at 1300 units/hr. Per the RN, there have been no issues with the infusion and the patient is without signs or symptoms of bleeding. CBC is WNL and stable.   Goal of Therapy:  Heparin level 0.3-0.7 units/ml Monitor platelets by anticoagulation protocol: Yes   Plan:  - Continue heparin IV at 1300 units/hr - Monitor a daily heparin level and CBC - Monitor for signs and symptoms of bleeding   Sanda Klein, PharmD, RPh  PGY-1 Pharmacy Resident 03/02/2020 8:39 AM  Please check [email or web address] for unit-specific pharmacy phone numbers.

## 2020-03-02 NOTE — Progress Notes (Addendum)
While patient lying down on Ct table,heart rate  went up from 70 NSR, >130's-140's. Denies chest pain or shortness of breath.Bp and sats unchanged.  Patient  Instructed to take deep breaths, and heart rate went back down into the 70's NSR. See flowsheet vitals.Dr. Mayford Knife made aware, ordered to cancel Coronary Ct.. Patient brought back into his room 6E09. Report given to primary RN Aisha and stated she  will continue to monitor patient and communicate with Dr Mayford Knife.

## 2020-03-02 NOTE — Progress Notes (Signed)
  Echocardiogram 2D Echocardiogram has been performed.  Delcie Roch 03/02/2020, 3:42 PM

## 2020-03-02 NOTE — Progress Notes (Signed)
ANTICOAGULATION CONSULT NOTE - Follow Up Consult  Pharmacy Consult for heparin Indication: chest pain/ACS  Labs: Recent Labs    03/01/20 1621 03/01/20 1923 03/02/20 0248  HGB 15.8  --  15.0  HCT 47.3  --  44.4  PLT 212  --  190  LABPROT  --   --  13.1  INR  --   --  1.0  HEPARINUNFRC  --   --  0.26*  CREATININE 1.23  --   --   TROPONINIHS 3 3  --     Assessment: 54yo male slightly subtherapeutic on heparin with initial dosing for CP; no gtt issues or signs of bleeding per RN.  Goal of Therapy:  Heparin level 0.3-0.7 units/ml   Plan:  Will increase heparin gtt by 1 units/kg/hr to 1300 units/hr and check level in 6 hours.    Vernard Gambles, PharmD, BCPS  03/02/2020,4:16 AM

## 2020-03-02 NOTE — Progress Notes (Addendum)
Progress Note  Patient Name: Spencer Thomas Date of Encounter: 03/02/2020  [facility] Cardiologist: NEW  Subjective   Admitted with chest pain but negative cardiac markers.  BNP normal.  Still having some dull pressure in his chest   Inpatient Medications    Scheduled Meds: . aspirin EC  81 mg Oral Daily  . atorvastatin  80 mg Oral Daily  . losartan  12.5 mg Oral Daily  . metoprolol tartrate  12.5 mg Oral BID   Continuous Infusions: . heparin 1,300 Units/hr (03/02/20 0417)   PRN Meds: acetaminophen, nitroGLYCERIN, ondansetron (ZOFRAN) IV   Vital Signs    Vitals:   03/02/20 0018 03/02/20 0144 03/02/20 0414 03/02/20 0743  BP: 114/75 128/67 119/69 (P) 115/80  Pulse: (!) 59 64 61 (P) 66  Resp: 20 19 20  (P) 20  Temp:  98.3 F (36.8 C) 97.8 F (36.6 C) (P) 98 F (36.7 C)  TempSrc:  Oral Oral (P) Oral  SpO2: 99% 97% 99% (P) 96%  Weight:  119.3 kg    Height:  5' 10.5" (1.791 m)      Intake/Output Summary (Last 24 hours) at 03/02/2020 0901 Last data filed at 03/02/2020 0402 Gross per 24 hour  Intake 102.43 ml  Output -  Net 102.43 ml   Last 3 Weights 03/02/2020 03/01/2020 02/04/2020  Weight (lbs) 263 lb 0.1 oz 265 lb 260 lb  Weight (kg) 119.3 kg 120.203 kg 117.935 kg      Telemetry    NSR - Personally Reviewed  ECG    NSR, LAD, LAFB, nonspecific T wave abnormality - Personally Reviewed  Physical Exam   GEN: No acute distress.   Neck: No JVD Cardiac: RRR, no murmurs, rubs, or gallops.  Respiratory: Clear to auscultation bilaterally. GI: Soft, nontender, non-distended  MS: No edema; No deformity. Neuro:  Nonfocal  Psych: Normal affect   Labs    High Sensitivity Troponin:   Recent Labs  Lab 02/04/20 1142 02/04/20 1424 03/01/20 1621 03/01/20 1923  TROPONINIHS 3 3 3 3       Chemistry Recent Labs  Lab 03/01/20 1621 03/02/20 0248  NA 141 139  K 4.5 3.7  CL 103 105  CO2 26 25  GLUCOSE 95 123*  BUN 16 14  CREATININE 1.23 1.14  CALCIUM 9.1  8.8*  GFRNONAA >60 >60  ANIONGAP 12 9     Hematology Recent Labs  Lab 03/01/20 1621 03/02/20 0248  WBC 5.8 6.3  RBC 5.42 5.16  HGB 15.8 15.0  HCT 47.3 44.4  MCV 87.3 86.0  MCH 29.2 29.1  MCHC 33.4 33.8  RDW 13.5 13.3  PLT 212 190    BNP Recent Labs  Lab 03/02/20 0005  BNP 35.0     DDimer No results for input(s): DDIMER in the last 168 hours.   Radiology    DG Chest 2 View  Result Date: 03/01/2020 CLINICAL DATA:  Chest pain. EXAM: CHEST - 2 VIEW COMPARISON:  February 04, 2020. FINDINGS: The heart size and mediastinal contours are within normal limits. Both lungs are clear. No pneumothorax or pleural effusion is noted. The visualized skeletal structures are unremarkable. IMPRESSION: No active cardiopulmonary disease. Electronically Signed   By: 03/03/2020 M.D.   On: 03/01/2020 16:42    Cardiac Studies   none  Patient Profile     55 y.o. male with history of hypertension and obesity who presents for chest pain.  Assessment & Plan    Chest pain -hsTrop negative x  2 -no further CP since admit -somewhat atypical in that initially it was stabbing but then became a pressure sensation with radiation to both arms.  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   -significant improved with SL NTG and much worse when he exerts himself -CP not worse with deep breathing and not worse lying supine -he had another episode earlier this year and was referred to [facility] for stress echo but this was not completed due to insurance issues -he has had some other episodes of CP in December but not as severe -CRFs include obesity, HTN and newly dx HLD with LDL 190 this admit -will get coronary CTA to define coronary anatomy today -continue ASA, BB and high dose statin -continue IV heparin gtt  HTN -BP controlled on exam today -continue Lopressor 12.5mg  BID and Losartan 12.5mg  daily  HLD -LDL 190 -started on Lipitor 80mg  daily -will need  repeat FLp and ALT in 6 weeks  I have spent a total of 35 minutes with patient reviewing  telemetry, EKGs, labs and examining patient as well as establishing an assessment and plan that was discussed with the patient.  > 50% of time was spent in direct patient care.    For questions or updates, please contact [facility] Please consult [email or web address] for contact info under        Signed, , MD  03/02/2020, 9:01 AM

## 2020-03-03 DIAGNOSIS — I471 Supraventricular tachycardia: Secondary | ICD-10-CM

## 2020-03-03 LAB — CBC
HCT: 43.7 % (ref 39.0–52.0)
Hemoglobin: 15.2 g/dL (ref 13.0–17.0)
MCH: 30.2 pg (ref 26.0–34.0)
MCHC: 34.8 g/dL (ref 30.0–36.0)
MCV: 86.9 fL (ref 80.0–100.0)
Platelets: 187 10*3/uL (ref 150–400)
RBC: 5.03 MIL/uL (ref 4.22–5.81)
RDW: 13.7 % (ref 11.5–15.5)
WBC: 6.7 10*3/uL (ref 4.0–10.5)
nRBC: 0 % (ref 0.0–0.2)

## 2020-03-03 LAB — BASIC METABOLIC PANEL
Anion gap: 7 (ref 5–15)
BUN: 15 mg/dL (ref 6–20)
CO2: 24 mmol/L (ref 22–32)
Calcium: 9 mg/dL (ref 8.9–10.3)
Chloride: 109 mmol/L (ref 98–111)
Creatinine, Ser: 1.19 mg/dL (ref 0.61–1.24)
GFR, Estimated: >60 mL/min (ref >60)
Glucose, Bld: 109 mg/dL — ABNORMAL HIGH (ref 70–99)
Potassium: 4.1 mmol/L (ref 3.5–5.1)
Sodium: 140 mmol/L (ref 135–145)

## 2020-03-03 LAB — HEPARIN LEVEL (UNFRACTIONATED)
Heparin Unfractionated: >2.2 IU/mL — ABNORMAL HIGH (ref 0.30–0.70)
Heparin Unfractionated: 0.46 IU/mL (ref 0.30–0.70)

## 2020-03-03 LAB — MAGNESIUM: Magnesium: 2.1 mg/dL (ref 1.7–2.4)

## 2020-03-03 NOTE — Progress Notes (Addendum)
Progress Note  Patient Name: Spencer Thomas Date of Encounter: 03/03/2020  [facility] Cardiologist: NEW  Subjective   Tried to do coronary CTA yesterday but HR too fast after giving 100mg  Lopressor.  Went into SVT in CT right before getting IV Lopressor.  Coronary CTA cancelled and he spontaneously converted to NSR.  Denies any current chest pain.   Inpatient Medications    Scheduled Meds: . aspirin EC  81 mg Oral Daily  . atorvastatin  80 mg Oral Daily  . losartan  12.5 mg Oral Daily  . metoprolol tartrate  5 mg Intravenous Once  . metoprolol tartrate  12.5 mg Oral BID   Continuous Infusions: . heparin 1,300 Units/hr (03/03/20 0745)   PRN Meds: acetaminophen, iohexol, nitroGLYCERIN, ondansetron (ZOFRAN) IV   Vital Signs    Vitals:   03/02/20 1719 03/02/20 1721 03/02/20 2146 03/02/20 2338  BP: 117/86  (!) 103/59 99/71  Pulse: (!) 130 76 (!) 58 (!) 55  Resp: 18  19 20   Temp:   97.6 F (36.4 C) 98.1 F (36.7 C)  TempSrc:   Oral Oral  SpO2:   97% 95%  Weight:      Height:        Intake/Output Summary (Last 24 hours) at 03/03/2020 0815 Last data filed at 03/03/2020 0300 Gross per 24 hour  Intake 658.29 ml  Output -  Net 658.29 ml   Last 3 Weights 03/02/2020 03/01/2020 02/04/2020  Weight (lbs) 263 lb 0.1 oz 265 lb 260 lb  Weight (kg) 119.3 kg 120.203 kg 117.935 kg      Telemetry    NSR with no further afib - Personally Reviewed  ECG    NSR with inferolateral T wave abnormality - Personally Reviewed  Physical Exam   GEN: Well nourished, well developed in no acute distress HEENT: Normal NECK: No JVD; No carotid bruits LYMPHATICS: No lymphadenopathy CARDIAC:RRR, no murmurs, rubs, gallops RESPIRATORY:  Clear to auscultation without rales, wheezing or rhonchi  ABDOMEN: Soft, non-tender, non-distended MUSCULOSKELETAL:  No edema; No deformity  SKIN: Warm and dry NEUROLOGIC:  Alert and oriented x 3 PSYCHIATRIC:  Normal affect    Labs    High  Sensitivity Troponin:   Recent Labs  Lab 02/04/20 1142 02/04/20 1424 03/01/20 1621 03/01/20 1923  TROPONINIHS 3 3 3 3       Chemistry Recent Labs  Lab 03/01/20 1621 03/02/20 0248  NA 141 139  K 4.5 3.7  CL 103 105  CO2 26 25  GLUCOSE 95 123*  BUN 16 14  CREATININE 1.23 1.14  CALCIUM 9.1 8.8*  GFRNONAA >60 >60  ANIONGAP 12 9     Hematology Recent Labs  Lab 03/01/20 1621 03/02/20 0248 03/03/20 0301  WBC 5.8 6.3 6.7  RBC 5.42 5.16 5.03  HGB 15.8 15.0 15.2  HCT 47.3 44.4 43.7  MCV 87.3 86.0 86.9  MCH 29.2 29.1 30.2  MCHC 33.4 33.8 34.8  RDW 13.5 13.3 13.7  PLT 212 190 187    BNP Recent Labs  Lab 03/02/20 0005  BNP 35.0     DDimer No results for input(s): DDIMER in the last 168 hours.   Radiology    DG Chest 2 View  Result Date: 03/01/2020 CLINICAL DATA:  Chest pain. EXAM: CHEST - 2 VIEW COMPARISON:  February 04, 2020. FINDINGS: The heart size and mediastinal contours are within normal limits. Both lungs are clear. No pneumothorax or pleural effusion is noted. The visualized skeletal structures are unremarkable. IMPRESSION: No  active cardiopulmonary disease. Electronically Signed   By: Lupita Raider M.D.   On: 03/01/2020 16:42   ECHOCARDIOGRAM COMPLETE  Result Date: 03/02/2020    ECHOCARDIOGRAM REPORT   Patient Name:   Spencer Thomas Date of Exam: 03/02/2020 Medical Rec #:  [id number]    Height:       70.5 in Accession #:    [id number]   Weight:       263.0 lb Date of Birth:  1965-09-07    BSA:          2.357 m Patient Age:    54 years     BP:           107/74 mmHg Patient Gender: M            HR:           60 bpm. Exam Location:  Inpatient Procedure: 2D Echo Indications:    chest pain  History:        Patient has no prior history of Echocardiogram examinations.                 Risk Factors:Hypertension.  Sonographer:    Delcie Roch Referring Phys: [id number] CARRIEL T NIPP IMPRESSIONS  1. Left ventricular ejection fraction, by estimation, is 60 to 65%. The  left ventricle has normal function. The left ventricle has no regional wall motion abnormalities. Left ventricular diastolic parameters are consistent with Grade I diastolic dysfunction (impaired relaxation).  2. Right ventricular systolic function is normal. The right ventricular size is normal. There is normal pulmonary artery systolic pressure.  3. The mitral valve is normal in structure. Trivial mitral valve regurgitation. No evidence of mitral stenosis.  4. The aortic valve is tricuspid. Aortic valve regurgitation is not visualized. Mild aortic valve sclerosis is present, with no evidence of aortic valve stenosis.  5. There is mild dilatation of the aortic root, measuring 38 mm.  6. The inferior vena cava is normal in size with greater than 50% respiratory variability, suggesting right atrial pressure of 3 mmHg. FINDINGS  Left Ventricle: Left ventricular ejection fraction, by estimation, is 60 to 65%. The left ventricle has normal function. The left ventricle has no regional wall motion abnormalities. The left ventricular internal cavity size was normal in size. There is  no left ventricular hypertrophy. Left ventricular diastolic parameters are consistent with Grade I diastolic dysfunction (impaired relaxation). Right Ventricle: The right ventricular size is normal. No increase in right ventricular wall thickness. Right ventricular systolic function is normal. There is normal pulmonary artery systolic pressure. The tricuspid regurgitant velocity is 1.86 m/s, and  with an assumed right atrial pressure of 3 mmHg, the estimated right ventricular systolic pressure is 16.8 mmHg. Left Atrium: Left atrial size was normal in size. Right Atrium: Right atrial size was normal in size. Pericardium: There is no evidence of pericardial effusion. Mitral Valve: The mitral valve is normal in structure. Trivial mitral valve regurgitation. No evidence of mitral valve stenosis. Tricuspid Valve: The tricuspid valve is normal in  structure. Tricuspid valve regurgitation is trivial. No evidence of tricuspid stenosis. Aortic Valve: The aortic valve is tricuspid. Aortic valve regurgitation is not visualized. Mild aortic valve sclerosis is present, with no evidence of aortic valve stenosis. Pulmonic Valve: The pulmonic valve was normal in structure. Pulmonic valve regurgitation is not visualized. No evidence of pulmonic stenosis. Aorta: The aortic root is normal in size and structure. There is mild dilatation of the aortic root, measuring 38 mm. Venous:  The inferior vena cava is normal in size with greater than 50% respiratory variability, suggesting right atrial pressure of 3 mmHg. IAS/Shunts: No atrial level shunt detected by color flow Doppler.  LEFT VENTRICLE PLAX 2D LVIDd:         4.60 cm LVIDs:         3.20 cm LV PW:         1.00 cm LV IVS:        0.90 cm  LV Volumes (MOD) LV vol d, MOD A2C: 69.6 ml LV vol s, MOD A2C: 30.7 ml LV SV MOD A2C:     38.9 ml RIGHT VENTRICLE             IVC RV S prime:     11.10 cm/s  IVC diam: 1.80 cm TAPSE (M-mode): 2.0 cm LEFT ATRIUM             Index       RIGHT ATRIUM           Index LA diam:        3.40 cm 1.44 cm/m  RA Area:     15.20 cm LA Vol (A2C):   49.8 ml 21.12 ml/m RA Volume:   36.10 ml  15.31 ml/m LA Vol (A4C):   46.2 ml 19.60 ml/m LA Biplane Vol: 50.2 ml 21.29 ml/m  AORTIC VALVE LVOT Vmax:   89.40 cm/s LVOT Vmean:  60.000 cm/s LVOT VTI:    0.208 m  AORTA Ao Root diam: 3.80 cm Ao Asc diam:  3.30 cm MITRAL VALVE               TRICUSPID VALVE MV Area (PHT): 3.37 cm    TR Peak grad:   13.8 mmHg MV Decel Time: 225 msec    TR Vmax:        186.00 cm/s MV E velocity: 51.80 cm/s MV A velocity: 70.70 cm/s  SHUNTS MV E/A ratio:  0.73        Systemic VTI: 0.21 m Armanda Magic MD Electronically signed by Armanda Magic MD Signature Date/Time: 03/02/2020/3:50:33 PM    Final     Cardiac Studies   2D echo  IMPRESSIONS   1. Left ventricular ejection fraction, by estimation, is 60 to 65%. The  left  ventricle has normal function. The left ventricle has no regional  wall motion abnormalities. Left ventricular diastolic parameters are  consistent with Grade I diastolic  dysfunction (impaired relaxation).  2. Right ventricular systolic function is normal. The right ventricular  size is normal. There is normal pulmonary artery systolic pressure.  3. The mitral valve is normal in structure. Trivial mitral valve  regurgitation. No evidence of mitral stenosis.  4. The aortic valve is tricuspid. Aortic valve regurgitation is not  visualized. Mild aortic valve sclerosis is present, with no evidence of  aortic valve stenosis.  5. There is mild dilatation of the aortic root, measuring 38 mm.  6. The inferior vena cava is normal in size with greater than 50%  respiratory variability, suggesting right atrial pressure of 3 mmHg.   Patient Profile     55 y.o. male with history of hypertension and obesity who presents for chest pain.  Assessment & Plan    Chest pain -hsTrop negative x 2 -no further CP since admit -somewhat atypical in that initially it was stabbing but then became a pressure sensation with radiation to both arms.  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   -  significant improved with SL NTG and much worse when he exerts himself -CP not worse with deep breathing and not worse lying supine -he had another episode earlier this year and [facility] but this was not completed due to insurance issues -he has had some other episodes of CP in December but not as severe -CRFs include obesity, HTN and newly dx HLD with LDL 190 this admit -tried to get coronary CTA but could not get HR adequately controlled and then he went into SVT in CT and scan cancelled -I am concerned about the EKG changes along with very high LDL >>prefer for him to stay and get cardiac cath tomorrow. -Cardiac catheterization was discussed with the  patient fully. The patient understands that risks include but are not limited to stroke (1 in 1000), death (1 in 1000), kidney failure [usually temporary] (1 in 500), bleeding (1 in 200), allergic reaction [possibly serious] (1 in 200).  The patient understands and is willing to proceed.   -continue ASA, BB and high dose statin -continue IV heparin gtt  HTN -BP controlled on exam and soft -continue Lopressor 12.5mg  BID  -stop ARB to allow more BP to titrate BB if needed for PAF  HLD -LDL 190 -started on Lipitor 80mg  daily -will need repeat FLp and ALT in 6 weeks  SVT -this was transient and no further episodes -cannot discern if atypical atrial flutter vs. AVNRT -CHADS2VASC score is 1 for HTN  -continue low dose BB>>cannot titrate further due to bradycardia -consider outpt event monitor to assess for further atrial arrhythmias -check BMET and Mag  I have spent a total of 35 minutes with patient reviewing  telemetry, 2D echo, EKGs, labs and examining patient as well as establishing an assessment and plan that was discussed with the patient.  > 50% of time was spent in direct patient care.    For questions or updates, please contact [facility] Please consult [email or web address] for contact info under        Signed, Armanda Magicraci Turner, MD  03/03/2020, 8:15 AM

## 2020-03-03 NOTE — Progress Notes (Signed)
ANTICOAGULATION CONSULT NOTE - Follow Up Consult  Pharmacy Consult for Heparin Indication: chest pain/ACS  No Known Allergies  Patient Measurements: Height: 5' 10.5" (179.1 cm) Weight: 119.3 kg (263 lb 0.1 oz) IBW/kg (Calculated) : 74.15 Heparin Dosing Weight: 100.7 kg  Vital Signs: Temp: 98.1 F (36.7 C) (01/22 2338) Temp Source: Oral (01/22 2338) BP: 99/71 (01/22 2338) Pulse Rate: 55 (01/22 2338)  Labs: Recent Labs    03/01/20 1621 03/01/20 1621 03/01/20 1923 03/02/20 0248 03/02/20 1011 03/03/20 0301 03/03/20 0556  HGB 15.8  --   --  15.0  --  15.2  --   HCT 47.3  --   --  44.4  --  43.7  --   PLT 212  --   --  190  --  187  --   LABPROT  --   --   --  13.1  --   --   --   INR  --   --   --  1.0  --   --   --   HEPARINUNFRC  --    < >  --  0.26* 0.38 >2.20* 0.46  CREATININE 1.23  --   --  1.14  --   --   --   TROPONINIHS 3  --  3  --   --   --   --    < > = values in this interval not displayed.    Estimated Creatinine Clearance: 98.8 mL/min (by C-G formula based on SCr of 1.14 mg/dL).   Assessment: 55 yo male presents with severe, acute onset chest pain.The patient was planned to get a coronary CT today, but was canceled overnight. PTA the patient is not anticoagulation. Pharmacy is consulted to dose heparin.   Heparin level is therapeutic at 0.46 while running at 1300 units/hr (first heparin level of >2.20 was drawn from the wrong line). The patient has had two therapeutic heparin levels in a row. Per the RN, there have been no issues with the infusion and the patient is without signs or symptoms of bleeding. CBC is WNL and stable.   Goal of Therapy:  Heparin level 0.3-0.7 units/ml Monitor platelets by anticoagulation protocol: Yes   Plan:  - Continue heparin IV at 1300 units/hr - Monitor a daily heparin level and CBC - Monitor for signs and symptoms of bleeding   Sanda Klein, PharmD, RPh  PGY-1 Pharmacy Resident 03/03/2020 7:34 AM  Please check  [email or web address] for unit-specific pharmacy phone numbers.

## 2020-03-03 NOTE — Progress Notes (Signed)
Held pt lopressor 2200 due to low HR/BP.         Pt HR dropped to the 40's non-sustained during the night

## 2020-03-04 ENCOUNTER — Encounter (HOSPITAL_COMMUNITY): Payer: Self-pay | Admitting: Cardiovascular Disease

## 2020-03-04 ENCOUNTER — Encounter: Payer: Self-pay | Admitting: *Deleted

## 2020-03-04 ENCOUNTER — Inpatient Hospital Stay (HOSPITAL_COMMUNITY): Payer: 59

## 2020-03-04 ENCOUNTER — Other Ambulatory Visit: Payer: Self-pay | Admitting: Physician Assistant

## 2020-03-04 ENCOUNTER — Encounter (HOSPITAL_COMMUNITY)
Admission: EM | Disposition: A | Discharge status: Home / Self Care | Payer: Self-pay | Source: Home / Self Care | Attending: Internal Medicine

## 2020-03-04 DIAGNOSIS — I471 Supraventricular tachycardia: Secondary | ICD-10-CM

## 2020-03-04 DIAGNOSIS — I208 Other forms of angina pectoris: Principal | ICD-10-CM

## 2020-03-04 HISTORY — PX: CORONARY ANGIOGRAPHY: CATH118303

## 2020-03-04 LAB — CBC
HCT: 44.2 % (ref 39.0–52.0)
Hemoglobin: 14.8 g/dL (ref 13.0–17.0)
MCH: 29.4 pg (ref 26.0–34.0)
MCHC: 33.5 g/dL (ref 30.0–36.0)
MCV: 87.9 fL (ref 80.0–100.0)
Platelets: 200 10*3/uL (ref 150–400)
RBC: 5.03 MIL/uL (ref 4.22–5.81)
RDW: 13.7 % (ref 11.5–15.5)
WBC: 6.8 10*3/uL (ref 4.0–10.5)
nRBC: 0 % (ref 0.0–0.2)

## 2020-03-04 LAB — BASIC METABOLIC PANEL
Anion gap: 9 (ref 5–15)
BUN: 15 mg/dL (ref 6–20)
CO2: 25 mmol/L (ref 22–32)
Calcium: 9.1 mg/dL (ref 8.9–10.3)
Chloride: 104 mmol/L (ref 98–111)
Creatinine, Ser: 1.29 mg/dL — ABNORMAL HIGH (ref 0.61–1.24)
GFR, Estimated: >60 mL/min (ref >60)
Glucose, Bld: 136 mg/dL — ABNORMAL HIGH (ref 70–99)
Potassium: 4.2 mmol/L (ref 3.5–5.1)
Sodium: 138 mmol/L (ref 135–145)

## 2020-03-04 LAB — HEPARIN LEVEL (UNFRACTIONATED): Heparin Unfractionated: 0.16 IU/mL — ABNORMAL LOW (ref 0.30–0.70)

## 2020-03-04 SURGERY — CORONARY ANGIOGRAPHY (CATH LAB)
Anesthesia: LOCAL

## 2020-03-04 MED ORDER — MIDAZOLAM HCL 2 MG/2ML IJ SOLN
INTRAMUSCULAR | Status: DC | PRN
  Administered 2020-03-04: 1 mg via INTRAVENOUS

## 2020-03-04 MED ORDER — ONDANSETRON HCL 4 MG/2ML IJ SOLN: 4.0000 mg | Freq: Four times a day (QID) | INTRAMUSCULAR | Status: DC | PRN

## 2020-03-04 MED ORDER — HEPARIN (PORCINE) IN NACL 1000-0.9 UT/500ML-% IV SOLN
INTRAVENOUS | Status: AC
  Filled 2020-03-04: qty 1000

## 2020-03-04 MED ORDER — HYDRALAZINE HCL 20 MG/ML IJ SOLN: 10.0000 mg | INTRAMUSCULAR | Status: AC | PRN

## 2020-03-04 MED ORDER — SODIUM CHLORIDE 0.9 % IV SOLN: 250.0000 mL | INTRAVENOUS | Status: DC | PRN

## 2020-03-04 MED ORDER — MORPHINE SULFATE (PF) 2 MG/ML IV SOLN: 2.0000 mg | INTRAVENOUS | Status: DC | PRN

## 2020-03-04 MED ORDER — SODIUM CHLORIDE 0.9% FLUSH
3.0000 mL | Freq: Two times a day (BID) | INTRAVENOUS | Status: DC
  Administered 2020-03-04: 3 mL via INTRAVENOUS

## 2020-03-04 MED ORDER — SODIUM CHLORIDE 0.9% FLUSH: 3.0000 mL | Freq: Two times a day (BID) | INTRAVENOUS | Status: DC

## 2020-03-04 MED ORDER — ATORVASTATIN CALCIUM 80 MG PO TABS: 80.0000 mg | ORAL_TABLET | Freq: Every day | ORAL | 11 refills | Status: DC

## 2020-03-04 MED ORDER — VERAPAMIL HCL 2.5 MG/ML IV SOLN
INTRAVENOUS | Status: AC
  Filled 2020-03-04: qty 2

## 2020-03-04 MED ORDER — SODIUM CHLORIDE 0.9% FLUSH: 3.0000 mL | INTRAVENOUS | Status: DC | PRN

## 2020-03-04 MED ORDER — HEPARIN (PORCINE) IN NACL 1000-0.9 UT/500ML-% IV SOLN
INTRAVENOUS | Status: DC | PRN
  Administered 2020-03-04 (×2): 500 mL

## 2020-03-04 MED ORDER — SODIUM CHLORIDE 0.9 % IV SOLN: INTRAVENOUS | Status: AC

## 2020-03-04 MED ORDER — VERAPAMIL HCL 2.5 MG/ML IV SOLN
INTRA_ARTERIAL | Status: DC | PRN
  Administered 2020-03-04 (×2): 5 mL via INTRA_ARTERIAL

## 2020-03-04 MED ORDER — SODIUM CHLORIDE 0.9 % WEIGHT BASED INFUSION: 3.0000 mL/kg/h | INTRAVENOUS | Status: DC

## 2020-03-04 MED ORDER — IOHEXOL 350 MG/ML SOLN
INTRAVENOUS | Status: DC | PRN
  Administered 2020-03-04: 50 mL via INTRA_ARTERIAL

## 2020-03-04 MED ORDER — FENTANYL CITRATE (PF) 100 MCG/2ML IJ SOLN
INTRAMUSCULAR | Status: DC | PRN
  Administered 2020-03-04: 25 ug via INTRAVENOUS

## 2020-03-04 MED ORDER — ATORVASTATIN CALCIUM 80 MG PO TABS
80.0000 mg | ORAL_TABLET | Freq: Every day | ORAL | Status: DC
  Administered 2020-03-04: 80 mg via ORAL
  Filled 2020-03-04: qty 1

## 2020-03-04 MED ORDER — PANTOPRAZOLE SODIUM 40 MG PO TBEC: 40.0000 mg | DELAYED_RELEASE_TABLET | Freq: Every day | ORAL | Status: DC

## 2020-03-04 MED ORDER — LIDOCAINE HCL (PF) 1 % IJ SOLN
INTRAMUSCULAR | Status: DC | PRN
  Administered 2020-03-04: 2 mL via SUBCUTANEOUS

## 2020-03-04 MED ORDER — METOPROLOL SUCCINATE ER 25 MG PO TB24: 25.0000 mg | ORAL_TABLET | Freq: Every day | ORAL | 11 refills | Status: DC

## 2020-03-04 MED ORDER — MIDAZOLAM HCL 2 MG/2ML IJ SOLN
INTRAMUSCULAR | Status: AC
  Filled 2020-03-04: qty 2

## 2020-03-04 MED ORDER — METOPROLOL SUCCINATE ER 25 MG PO TB24: 25.0000 mg | ORAL_TABLET | Freq: Every day | ORAL | 3 refills | Status: DC

## 2020-03-04 MED ORDER — LABETALOL HCL 5 MG/ML IV SOLN: 10.0000 mg | INTRAVENOUS | Status: AC | PRN

## 2020-03-04 MED ORDER — SODIUM CHLORIDE 0.9 % WEIGHT BASED INFUSION
3.0000 mL/kg/h | INTRAVENOUS | Status: DC
  Administered 2020-03-04: 3 mL/kg/h via INTRAVENOUS

## 2020-03-04 MED ORDER — FENTANYL CITRATE (PF) 100 MCG/2ML IJ SOLN
INTRAMUSCULAR | Status: AC
  Filled 2020-03-04: qty 2

## 2020-03-04 MED ORDER — NITROGLYCERIN 1 MG/10 ML FOR IR/CATH LAB
INTRA_ARTERIAL | Status: AC
  Filled 2020-03-04: qty 10

## 2020-03-04 MED ORDER — LIDOCAINE HCL (PF) 1 % IJ SOLN
INTRAMUSCULAR | Status: AC
  Filled 2020-03-04: qty 30

## 2020-03-04 MED ORDER — ACETAMINOPHEN 325 MG PO TABS: 650.0000 mg | ORAL_TABLET | ORAL | Status: DC | PRN

## 2020-03-04 MED ORDER — METOPROLOL SUCCINATE ER 25 MG PO TB24: 25.0000 mg | ORAL_TABLET | Freq: Every day | ORAL | Status: DC

## 2020-03-04 MED ORDER — HEPARIN SODIUM (PORCINE) 1000 UNIT/ML IJ SOLN
INTRAMUSCULAR | Status: AC
  Filled 2020-03-04: qty 1

## 2020-03-04 MED ORDER — SODIUM CHLORIDE 0.9 % WEIGHT BASED INFUSION
1.0000 mL/kg/h | INTRAVENOUS | Status: DC
  Administered 2020-03-04: 1 mL/kg/h via INTRAVENOUS

## 2020-03-04 MED ORDER — SODIUM CHLORIDE 0.9 % WEIGHT BASED INFUSION: 1.0000 mL/kg/h | INTRAVENOUS | Status: DC

## 2020-03-04 MED ORDER — HEPARIN BOLUS VIA INFUSION
3000.0000 [IU] | Freq: Once | INTRAVENOUS | Status: AC
  Administered 2020-03-04: 3000 [IU] via INTRAVENOUS
  Filled 2020-03-04: qty 3000

## 2020-03-04 MED ORDER — HEPARIN SODIUM (PORCINE) 1000 UNIT/ML IJ SOLN
INTRAMUSCULAR | Status: DC | PRN
  Administered 2020-03-04: 6000 [IU] via INTRAVENOUS

## 2020-03-04 MED ORDER — PANTOPRAZOLE SODIUM 40 MG PO TBEC: 40.0000 mg | DELAYED_RELEASE_TABLET | Freq: Two times a day (BID) | ORAL | 11 refills | Status: DC

## 2020-03-04 SURGICAL SUPPLY — 15 items
BAG SNAP BAND KOVER 36X36 (MISCELLANEOUS) ×2 IMPLANT
CATH INFINITI 5 FR JL3.5 (CATHETERS) ×1 IMPLANT
CATH INFINITI 5FR ANG PIGTAIL (CATHETERS) ×1 IMPLANT
CATH INFINITI JR4 5F (CATHETERS) ×2 IMPLANT
CATH OPTITORQUE TIG 4.0 5F (CATHETERS) ×2 IMPLANT
COVER DOME SNAP 22 D (MISCELLANEOUS) ×1 IMPLANT
DEVICE RAD COMP TR BAND LRG (VASCULAR PRODUCTS) ×1 IMPLANT
GLIDESHEATH SLEND A-KIT 6F 22G (SHEATH) ×1 IMPLANT
GUIDEWIRE INQWIRE 1.5J.035X260 (WIRE) ×1 IMPLANT
INQWIRE 1.5J .035X260CM (WIRE) ×2
KIT HEART LEFT (KITS) ×2 IMPLANT
PACK CARDIAC CATHETERIZATION (CUSTOM PROCEDURE TRAY) ×2 IMPLANT
TRANSDUCER W/STOPCOCK (MISCELLANEOUS) ×2 IMPLANT
TUBING CIL FLEX 10 FLL-RA (TUBING) ×2 IMPLANT
WIRE HI TORQ VERSACORE-J 145CM (WIRE) ×1 IMPLANT

## 2020-03-04 NOTE — Progress Notes (Addendum)
Reviewed findings of cardiac cath showing normal coronary arteries and normal LVF.  Noncardiac CP.  Increase Protonix 40mg  BID and followup with PCP.  Change Lopressor to Toprol XL 25mg  daily for suppression of SVT.  Needs outpt event monitor to assess for SVT load.  Followup in 4 weeks with PA.  Continue Atorvastatin for high LDL. Will need FLp and ALT in 6 weeks.

## 2020-03-04 NOTE — Progress Notes (Signed)
ANTICOAGULATION CONSULT NOTE - Follow Up Consult  Pharmacy Consult for heparin Indication: chest pain/ACS  Labs: Recent Labs    03/01/20 1621 03/01/20 1923 03/02/20 0248 03/02/20 1011 03/03/20 0301 03/03/20 0556 03/04/20 0109  HGB 15.8  --  15.0  --  15.2  --  14.8  HCT 47.3  --  44.4  --  43.7  --  44.2  PLT 212  --  190  --  187  --  200  LABPROT  --   --  13.1  --   --   --   --   INR  --   --  1.0  --   --   --   --   HEPARINUNFRC  --   --  0.26*   < > >2.20* 0.46 0.16*  CREATININE 1.23  --  1.14  --   --  1.19 1.29*  TROPONINIHS 3 3  --   --   --   --   --    < > = values in this interval not displayed.    Assessment: 55yo male now subtherapeutic on heparin after two levels at goal; no gtt issues or signs of bleeding per RN.  Goal of Therapy:  Heparin level 0.3-0.7 units/ml   Plan:  Will give heparin bolus of 3000 units and increase heparin gtt by 3 units/kg/hr to 1600 units/hr and check level in 6 hours.    Vernard Gambles, PharmD, BCPS  03/04/2020,2:20 AM

## 2020-03-04 NOTE — H&P (View-Only) (Signed)
Progress Note  Patient Name: Spencer Thomas Date of Encounter: 03/04/2020  [facility] Cardiologist: NEW  Subjective   No further CP today.  For LHC later today for workup of CP  Inpatient Medications    Scheduled Meds: . aspirin EC  81 mg Oral Daily  . atorvastatin  80 mg Oral Daily  . metoprolol tartrate  5 mg Intravenous Once  . metoprolol tartrate  12.5 mg Oral BID  . sodium chloride flush  3 mL Intravenous Q12H   Continuous Infusions: . sodium chloride    . sodium chloride 1 mL/kg/hr (03/04/20 0615)  . heparin 1,600 Units/hr (03/04/20 0223)   PRN Meds: sodium chloride, acetaminophen, iohexol, nitroGLYCERIN, ondansetron (ZOFRAN) IV, sodium chloride flush   Vital Signs    Vitals:   03/03/20 1343 03/03/20 2027 03/04/20 0027 03/04/20 0518  BP: 105/67 96/67 122/77 115/76  Pulse: 62 70 62 66  Resp:  18 16 15   Temp: 98 F (36.7 C) (!) 97.5 F (36.4 C) 97.8 F (36.6 C) 97.6 F (36.4 C)  TempSrc: Oral Oral Oral Oral  SpO2: 99% 98% 96% 98%  Weight:    120.2 kg  Height:        Intake/Output Summary (Last 24 hours) at 03/04/2020 0820 Last data filed at 03/04/2020 03/06/2020 Gross per 24 hour  Intake 740 ml  Output --  Net 740 ml   Last 3 Weights 03/04/2020 03/02/2020 03/01/2020  Weight (lbs) 265 lb 263 lb 0.1 oz 265 lb  Weight (kg) 120.203 kg 119.3 kg 120.203 kg      Telemetry    NSR - Personally Reviewed  ECG  No new EKG to review - Personally Reviewed  Physical Exam   GEN: Well nourished, well developed in no acute distress HEENT: Normal NECK: No JVD; No carotid bruits LYMPHATICS: No lymphadenopathy CARDIAC:RRR, no murmurs, rubs, gallops RESPIRATORY:  Clear to auscultation without rales, wheezing or rhonchi  ABDOMEN: Soft, non-tender, non-distended MUSCULOSKELETAL:  No edema; No deformity  SKIN: Warm and dry NEUROLOGIC:  Alert and oriented x 3 PSYCHIATRIC:  Normal affect    Labs    High Sensitivity Troponin:   Recent Labs  Lab 02/04/20 1142  02/04/20 1424 03/01/20 1621 03/01/20 1923  TROPONINIHS 3 3 3 3       Chemistry Recent Labs  Lab 03/02/20 0248 03/03/20 0556 03/04/20 0109  NA 139 140 138  K 3.7 4.1 4.2  CL 105 109 104  CO2 25 24 25   GLUCOSE 123* 109* 136*  BUN 14 15 15   CREATININE 1.14 1.19 1.29*  CALCIUM 8.8* 9.0 9.1  GFRNONAA >60 >60 >60  ANIONGAP 9 7 9      Hematology Recent Labs  Lab 03/02/20 0248 03/03/20 0301 03/04/20 0109  WBC 6.3 6.7 6.8  RBC 5.16 5.03 5.03  HGB 15.0 15.2 14.8  HCT 44.4 43.7 44.2  MCV 86.0 86.9 87.9  MCH 29.1 30.2 29.4  MCHC 33.8 34.8 33.5  RDW 13.3 13.7 13.7  PLT 190 187 200    BNP Recent Labs  Lab 03/02/20 0005  BNP 35.0     DDimer No results for input(s): DDIMER in the last 168 hours.   Radiology    ECHOCARDIOGRAM COMPLETE  Result Date: 03/02/2020    ECHOCARDIOGRAM REPORT   Patient Name:   [name] Date of Exam: 03/02/2020 Medical Rec #:  03/05/20    Height:       70.5 in Accession #:    03/06/20   Weight:  263.0 lb Date of Birth:  12-18-1965    BSA:          2.357 m Patient Age:    54 years     BP:           107/74 mmHg Patient Gender: M            HR:           60 bpm. Exam Location:  Inpatient Procedure: 2D Echo Indications:    chest pain  History:        Patient has no prior history of Echocardiogram examinations.                 Risk Factors:Hypertension.  Sonographer:    Delcie Roch Referring Phys: [id number] CARRIEL T NIPP IMPRESSIONS  1. Left ventricular ejection fraction, by estimation, is 60 to 65%. The left ventricle has normal function. The left ventricle has no regional wall motion abnormalities. Left ventricular diastolic parameters are consistent with Grade I diastolic dysfunction (impaired relaxation).  2. Right ventricular systolic function is normal. The right ventricular size is normal. There is normal pulmonary artery systolic pressure.  3. The mitral valve is normal in structure. Trivial mitral valve regurgitation. No evidence of  mitral stenosis.  4. The aortic valve is tricuspid. Aortic valve regurgitation is not visualized. Mild aortic valve sclerosis is present, with no evidence of aortic valve stenosis.  5. There is mild dilatation of the aortic root, measuring 38 mm.  6. The inferior vena cava is normal in size with greater than 50% respiratory variability, suggesting right atrial pressure of 3 mmHg. FINDINGS  Left Ventricle: Left ventricular ejection fraction, by estimation, is 60 to 65%. The left ventricle has normal function. The left ventricle has no regional wall motion abnormalities. The left ventricular internal cavity size was normal in size. There is  no left ventricular hypertrophy. Left ventricular diastolic parameters are consistent with Grade I diastolic dysfunction (impaired relaxation). Right Ventricle: The right ventricular size is normal. No increase in right ventricular wall thickness. Right ventricular systolic function is normal. There is normal pulmonary artery systolic pressure. The tricuspid regurgitant velocity is 1.86 m/s, and  with an assumed right atrial pressure of 3 mmHg, the estimated right ventricular systolic pressure is 16.8 mmHg. Left Atrium: Left atrial size was normal in size. Right Atrium: Right atrial size was normal in size. Pericardium: There is no evidence of pericardial effusion. Mitral Valve: The mitral valve is normal in structure. Trivial mitral valve regurgitation. No evidence of mitral valve stenosis. Tricuspid Valve: The tricuspid valve is normal in structure. Tricuspid valve regurgitation is trivial. No evidence of tricuspid stenosis. Aortic Valve: The aortic valve is tricuspid. Aortic valve regurgitation is not visualized. Mild aortic valve sclerosis is present, with no evidence of aortic valve stenosis. Pulmonic Valve: The pulmonic valve was normal in structure. Pulmonic valve regurgitation is not visualized. No evidence of pulmonic stenosis. Aorta: The aortic root is normal in size and  structure. There is mild dilatation of the aortic root, measuring 38 mm. Venous: The inferior vena cava is normal in size with greater than 50% respiratory variability, suggesting right atrial pressure of 3 mmHg. IAS/Shunts: No atrial level shunt detected by color flow Doppler.  LEFT VENTRICLE PLAX 2D LVIDd:         4.60 cm LVIDs:         3.20 cm LV PW:         1.00 cm LV IVS:  0.90 cm  LV Volumes (MOD) LV vol d, MOD A2C: 69.6 ml LV vol s, MOD A2C: 30.7 ml LV SV MOD A2C:     38.9 ml RIGHT VENTRICLE             IVC RV S prime:     11.10 cm/s  IVC diam: 1.80 cm TAPSE (M-mode): 2.0 cm LEFT ATRIUM             Index       RIGHT ATRIUM           Index LA diam:        3.40 cm 1.44 cm/m  RA Area:     15.20 cm LA Vol (A2C):   49.8 ml 21.12 ml/m RA Volume:   36.10 ml  15.31 ml/m LA Vol (A4C):   46.2 ml 19.60 ml/m LA Biplane Vol: 50.2 ml 21.29 ml/m  AORTIC VALVE LVOT Vmax:   89.40 cm/s LVOT Vmean:  60.000 cm/s LVOT VTI:    0.208 m  AORTA Ao Root diam: 3.80 cm Ao Asc diam:  3.30 cm MITRAL VALVE               TRICUSPID VALVE MV Area (PHT): 3.37 cm    TR Peak grad:   13.8 mmHg MV Decel Time: 225 msec    TR Vmax:        186.00 cm/s MV E velocity: 51.80 cm/s MV A velocity: 70.70 cm/s  SHUNTS MV E/A ratio:  0.73        Systemic VTI: 0.21 m Armanda Magicraci Serigne Kubicek MD Electronically signed by Armanda Magicraci Deaglan Lile MD Signature Date/Time: 03/02/2020/3:50:33 PM    Final     Cardiac Studies   2D echo  IMPRESSIONS   1. Left ventricular ejection fraction, by estimation, is 60 to 65%. The  left ventricle has normal function. The left ventricle has no regional  wall motion abnormalities. Left ventricular diastolic parameters are  consistent with Grade I diastolic  dysfunction (impaired relaxation).  2. Right ventricular systolic function is normal. The right ventricular  size is normal. There is normal pulmonary artery systolic pressure.  3. The mitral valve is normal in structure. Trivial mitral valve  regurgitation. No evidence  of mitral stenosis.  4. The aortic valve is tricuspid. Aortic valve regurgitation is not  visualized. Mild aortic valve sclerosis is present, with no evidence of  aortic valve stenosis.  5. There is mild dilatation of the aortic root, measuring 38 mm.  6. The inferior vena cava is normal in size with greater than 50%  respiratory variability, suggesting right atrial pressure of 3 mmHg.   Patient Profile     55 y.o. male with history of hypertension and obesity who presents for chest pain.  Assessment & Plan    Chest pain -hsTrop negative x 2 -no further CP since admit -somewhat atypical in that initially it was stabbing but then became a pressure sensation with radiation to both arms.  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   -significant improved with SL NTG and much worse when he exerts himself -CP not worse with deep breathing and not worse lying supine -he had another episode earlier this year and was [facility] stress [facility] but this was not completed due to insurance issues -he has had some other episodes of CP in December but not as severe -CRFs include obesity, HTN and newly dx HLD with LDL 190 this admit -tried to get coronary  CTA but could not get HR adequately controlled and then he went into SVT in CT and scan cancelled -I am concerned about the EKG changes along with very high LDL that he could have CAD -Plan is for [facility] today to define coronary anatomy -continue ASA, BB and high dose statin -continue IV heparin gtt  HTN -BP well controlled on exam today -continue Lopressor 12.5mg  BID   -ARB stopped to allow more room to titrate BB for SVT  HLD -LDL 190 -started on Lipitor 80mg  daily -will need repeat FLp and ALT in 6 weeks  SVT -this was transient and no further episodes -cannot discern if atypical atrial flutter vs. AVNRT -CHADS2VASC score is 1 for HTN  -continue low dose BB>>cannot titrate further due to  bradycardia -consider outpt event monitor to assess for further atrial arrhythmias -K+ and Mag normal  I have spent a total of 35 minutes with patient reviewing  telemetry, 2D echo, EKGs, labs and examining patient as well as establishing an assessment and plan that was discussed with the patient.  > 50% of time was spent in direct patient care.    For questions or updates, please contact [facility] Please consult [email or web address] for contact info under        Signed, , MD  03/04/2020, 8:20 AM

## 2020-03-04 NOTE — Discharge Summary (Signed)
Discharge Summary    Patient ID: Spencer Thomas MRN: [id number]; DOB: 1965-02-22  Admit date: 03/01/2020 Discharge date: 03/04/2020  Primary Care Provider: Maudie Flakes, FNP  Primary Cardiologist: Armanda Magic, MD  Primary Electrophysiologist:  None   Discharge Diagnoses    Active Problems:   Chest pain   SVT (supraventricular tachycardia) (HCC)   Allergies No Known Allergies  Diagnostic Studies/Procedures    ECHO: 03/02/2020 1. Left ventricular ejection fraction, by estimation, is 60 to 65%. The  left ventricle has normal function. The left ventricle has no regional  wall motion abnormalities. Left ventricular diastolic parameters are  consistent with Grade I diastolic  dysfunction (impaired relaxation).  2. Right ventricular systolic function is normal. The right ventricular  size is normal. There is normal pulmonary artery systolic pressure.  3. The mitral valve is normal in structure. Trivial mitral valve  regurgitation. No evidence of mitral stenosis.  4. The aortic valve is tricuspid. Aortic valve regurgitation is not  visualized. Mild aortic valve sclerosis is present, with no evidence of  aortic valve stenosis.  5. There is mild dilatation of the aortic root, measuring 38 mm.  6. The inferior vena cava is normal in size with greater than 50%  respiratory variability, suggesting right atrial pressure of 3 mmHg.   CARDIAC CATH: 03/04/2020  History obtained from chart review.54 y.o.malewith history of hypertension, hyperlipidemia and obesity who presents for chest pain.  He was initially scheduled for a coronary CTA however he developed SVT and was unable to be slowed down.  The decision was then made to proceed with diagnostic coronary angiography.  His enzymes were negative.  His initial EKG did show nonspecific changes.   IMPRESSION: Spencer Thomas has normal coronary arteries by angiography and normal LV function by echo with negative enzymes.  I  believe his chest pain is noncardiac.  The sheath was removed and a TR band was placed on the right wrist to achieve patent hemostasis.  The patient left lab in stable condition.  Critical structure modification including treatment for hypertension hyperlipidemia will be pursued.  Dr. Mayford Knife was notified of these results.  _____________   History of Present Illness     Spencer Thomas is a 55 y.o. male with hx untreated HTN, HLD, and obesity, was admitted 01/21 with chest pain, tachycardia.  Hospital Course     Consultants: None   Spencer Thomas's chest pain was improved by sublingual nitroglycerin.  However, it dropped his blood pressure and he was symptomatic from that.  His cardiac enzymes were negative for MI and initially the plan was for a cardiac CT.    However, when he got on the CT table, he developed tachycardia.  On telemetry review, he had SVT with a rate of approximately 150.  No flutter waves seen and no ECG was obtained.  The arrhythmia resolved spontaneously.  He was started on a beta-blocker. He will need a  30-day event monitor.    His blood pressure was not well controlled on admission.  He was started on metoprolol, and his blood pressure has improved.  Continue the beta-blocker, changed to Toprol-XL 25 mg qd for once a day administration.  His lipid profile is below.  His LDL is significantly elevated at 190.  He was started on Lipitor 80 mg daily.  He is to eat a heart healthy diet and get lipids and liver functions rechecked in 6 weeks.  After the cardiac CT was canceled, cardiac cath was scheduled to  further define his anatomy.  The results are above, he had no coronary artery disease and his EF is normal by cath and by echo.  It was felt that his symptoms were most likely GI in origin.  He had been on Protonix 20 mg daily and this was increased to 40 mg bid.  Post cath, he was ambulating without chest pain or shortness of breath and his cath site and telemetry were  stable.  No further inpatient work-up is indicated and he is considered stable for discharge, to follow-up as an outpatient.  _____________  Discharge Vitals Blood pressure 122/88, pulse 60, temperature 97.6 F (36.4 C), temperature source Oral, resp. rate 13, height 5' 10.5" (1.791 m), weight 120.2 kg, SpO2 100 %.  Filed Weights   03/01/20 1611 03/02/20 0144 03/04/20 0518  Weight: 120.2 kg 119.3 kg 120.2 kg    Labs & Radiologic Studies    CBC Recent Labs    03/03/20 0301 03/04/20 0109  WBC 6.7 6.8  HGB 15.2 14.8  HCT 43.7 44.2  MCV 86.9 87.9  PLT 187 200   Basic Metabolic Panel Recent Labs    40/98/1101/23/22 0556 03/04/20 0109  NA 140 138  K 4.1 4.2  CL 109 104  CO2 24 25  GLUCOSE 109* 136*  BUN 15 15  CREATININE 1.19 1.29*  CALCIUM 9.0 9.1  MG 2.1  --    Liver Function Tests No results found for: ALT, AST, GGT, ALKPHOS, BILITOT  High Sensitivity Troponin:   Recent Labs  Lab 02/04/20 1142 02/04/20 1424 03/01/20 1621 03/01/20 1923  TROPONINIHS 3 3 3 3     Hemoglobin A1C Recent Labs    03/02/20 0005  HGBA1C 5.0   Fasting Lipid Panel Recent Labs    03/02/20 0248  CHOL 243*  HDL 40*  LDLCALC 190*  TRIG 63  CHOLHDL 6.1   Thyroid Function Tests Recent Labs    03/02/20 0005  TSH 1.616   _____________  DG Chest 2 View  Result Date: 03/01/2020 CLINICAL DATA:  Chest pain. EXAM: CHEST - 2 VIEW COMPARISON:  February 04, 2020. FINDINGS: The heart size and mediastinal contours are within normal limits. Both lungs are clear. No pneumothorax or pleural effusion is noted. The visualized skeletal structures are unremarkable. IMPRESSION: No active cardiopulmonary disease. Electronically Signed   By: Lupita RaiderJames  Green Jr M.D.   On: 03/01/2020 16:42   DG Chest 2 View  Result Date: 02/04/2020 CLINICAL DATA:  Chest pain EXAM: CHEST - 2 VIEW COMPARISON:  None FINDINGS: Normal heart size, mediastinal contours, and pulmonary vascularity. Lungs clear. No pleural effusion or  pneumothorax. Bones unremarkable. IMPRESSION: Normal exam. Electronically Signed   By: Ulyses SouthwardMark  Boles M.D.   On: 02/04/2020 11:51   CARDIAC CATHETERIZATION  Result Date: 03/04/2020 Spencer Thomas is a 55 y.o. male  [id number] LOCATION:  FACILITY: [facility] PHYSICIAN: Nanetta BattyJonathan Berry, M.D. December 31, 1965 DATE OF PROCEDURE:  03/04/2020 DATE OF DISCHARGE: CARDIAC CATHETERIZATION History obtained from chart review.55 y.o. male with history of hypertension, hyperlipidemia and obesity who presents for chest pain.  He was initially scheduled for a coronary CTA however he developed SVT and was unable to be slowed down.  The decision was then made to proceed with diagnostic coronary angiography.  His enzymes were negative.  His initial EKG did show nonspecific changes.   Spencer Thomas has normal coronary arteries by angiography and normal LV function by echo with negative enzymes.  I believe his chest pain is noncardiac.  The sheath was  removed and a TR band was placed on the right wrist to achieve patent hemostasis.  The patient left lab in stable condition.  Critical structure modification including treatment for hypertension hyperlipidemia will be pursued.  Dr. Mayford Knife was notified of these results. Nanetta Batty. MD, [facility] 03/04/2020 9:21 AM   ECHOCARDIOGRAM COMPLETE  Result Date: 03/02/2020    ECHOCARDIOGRAM REPORT   Patient Name:   Spencer Thomas Date of Exam: 03/02/2020 Medical Rec #:  [id number]    Height:       70.5 in Accession #:    [id number]   Weight:       263.0 lb Date of Birth:  02/02/1966    BSA:          2.357 m Patient Age:    54 years     BP:           107/74 mmHg Patient Gender: M            HR:           60 bpm. Exam Location:  Inpatient Procedure: 2D Echo Indications:    chest pain  History:        Patient has no prior history of Echocardiogram examinations.                 Risk Factors:Hypertension.  Sonographer:    Delcie Roch Referring Phys: [id number] CARRIEL T NIPP IMPRESSIONS  1. Left ventricular ejection  fraction, by estimation, is 60 to 65%. The left ventricle has normal function. The left ventricle has no regional wall motion abnormalities. Left ventricular diastolic parameters are consistent with Grade I diastolic dysfunction (impaired relaxation).  2. Right ventricular systolic function is normal. The right ventricular size is normal. There is normal pulmonary artery systolic pressure.  3. The mitral valve is normal in structure. Trivial mitral valve regurgitation. No evidence of mitral stenosis.  4. The aortic valve is tricuspid. Aortic valve regurgitation is not visualized. Mild aortic valve sclerosis is present, with no evidence of aortic valve stenosis.  5. There is mild dilatation of the aortic root, measuring 38 mm.  6. The inferior vena cava is normal in size with greater than 50% respiratory variability, suggesting right atrial pressure of 3 mmHg. FINDINGS  Left Ventricle: Left ventricular ejection fraction, by estimation, is 60 to 65%. The left ventricle has normal function. The left ventricle has no regional wall motion abnormalities. The left ventricular internal cavity size was normal in size. There is  no left ventricular hypertrophy. Left ventricular diastolic parameters are consistent with Grade I diastolic dysfunction (impaired relaxation). Right Ventricle: The right ventricular size is normal. No increase in right ventricular wall thickness. Right ventricular systolic function is normal. There is normal pulmonary artery systolic pressure. The tricuspid regurgitant velocity is 1.86 m/s, and  with an assumed right atrial pressure of 3 mmHg, the estimated right ventricular systolic pressure is 16.8 mmHg. Left Atrium: Left atrial size was normal in size. Right Atrium: Right atrial size was normal in size. Pericardium: There is no evidence of pericardial effusion. Mitral Valve: The mitral valve is normal in structure. Trivial mitral valve regurgitation. No evidence of mitral valve stenosis. Tricuspid  Valve: The tricuspid valve is normal in structure. Tricuspid valve regurgitation is trivial. No evidence of tricuspid stenosis. Aortic Valve: The aortic valve is tricuspid. Aortic valve regurgitation is not visualized. Mild aortic valve sclerosis is present, with no evidence of aortic valve stenosis. Pulmonic Valve: The pulmonic valve was normal in structure. Pulmonic  valve regurgitation is not visualized. No evidence of pulmonic stenosis. Aorta: The aortic root is normal in size and structure. There is mild dilatation of the aortic root, measuring 38 mm. Venous: The inferior vena cava is normal in size with greater than 50% respiratory variability, suggesting right atrial pressure of 3 mmHg. IAS/Shunts: No atrial level shunt detected by color flow Doppler.  LEFT VENTRICLE PLAX 2D LVIDd:         4.60 cm LVIDs:         3.20 cm LV PW:         1.00 cm LV IVS:        0.90 cm  LV Volumes (MOD) LV vol d, MOD A2C: 69.6 ml LV vol s, MOD A2C: 30.7 ml LV SV MOD A2C:     38.9 ml RIGHT VENTRICLE             IVC RV S prime:     11.10 cm/s  IVC diam: 1.80 cm TAPSE (M-mode): 2.0 cm LEFT ATRIUM             Index       RIGHT ATRIUM           Index LA diam:        3.40 cm 1.44 cm/m  RA Area:     15.20 cm LA Vol (A2C):   49.8 ml 21.12 ml/m RA Volume:   36.10 ml  15.31 ml/m LA Vol (A4C):   46.2 ml 19.60 ml/m LA Biplane Vol: 50.2 ml 21.29 ml/m  AORTIC VALVE LVOT Vmax:   89.40 cm/s LVOT Vmean:  60.000 cm/s LVOT VTI:    0.208 m  AORTA Ao Root diam: 3.80 cm Ao Asc diam:  3.30 cm MITRAL VALVE               TRICUSPID VALVE MV Area (PHT): 3.37 cm    TR Peak grad:   13.8 mmHg MV Decel Time: 225 msec    TR Vmax:        186.00 cm/s MV E velocity: 51.80 cm/s MV A velocity: 70.70 cm/s  SHUNTS MV E/A ratio:  0.73        Systemic VTI: 0.21 m Armanda Magicraci Turner MD Electronically signed by Armanda Magicraci Turner MD Signature Date/Time: 03/02/2020/3:50:33 PM    Final    Disposition   Pt is being discharged home today in improved condition.  Follow-up Plans  & Appointments     Follow-up Information    Quintella Reicherturner, Traci R, MD Follow up.   Specialty: Cardiology Why: The office will call to schedule the monitor. See to-do list, keep appts. Contact information: [address] [place] [address] [phone]        Laurann Montanaunn, Dayna N, PA-C Follow up.   Specialties: Cardiology, Radiology Why: Keep March 16 th appointment.  Contact information: [address] [place] [address] [phone]              Discharge Instructions    Call MD for:  redness, tenderness, or signs of infection (pain, swelling, redness, odor or green/yellow discharge around incision site)   Complete by: As directed    Diet - low sodium heart healthy   Complete by: As directed    Increase activity slowly   Complete by: As directed       Discharge Medications   Allergies as of 03/04/2020   No Known Allergies     Medication List    STOP taking these  medications   aspirin 81 MG EC tablet   pravastatin 40 MG tablet Commonly known as: PRAVACHOL     TAKE these medications   atorvastatin 80 MG tablet Commonly known as: LIPITOR Take 1 tablet (80 mg total) by mouth daily. Start taking on: March 05, 2020   Magnesium 100 MG Tabs Take 100 mg by mouth daily.   metoprolol succinate 25 MG 24 hr tablet Commonly known as: Toprol XL Take 1 tablet (25 mg total) by mouth daily. Take with or immediately following a meal.   MULTI VITAMIN DAILY PO Take 1 tablet by mouth daily.   naproxen 375 MG tablet Commonly known as: NAPROSYN Take 1 tablet (375 mg total) by mouth 2 (two) times daily.   pantoprazole 40 MG tablet Commonly known as: PROTONIX Take 1 tablet (40 mg total) by mouth 2 (two) times daily. What changed:   medication strength  how much to take          Outstanding Labs/Studies   None  Duration of Discharge Encounter   Greater than 30 minutes including physician time.  Signed, Theodore Demark,  PA-C 03/04/2020, 2:28 PM

## 2020-03-04 NOTE — Interval H&P Note (Signed)
Cath Lab Visit (complete for each Cath Lab visit)  Clinical Evaluation Leading to the Procedure:   ACS: Yes.    Non-ACS:    Anginal Classification: CCS II  Anti-ischemic medical therapy: No Therapy  Non-Invasive Test Results: No non-invasive testing performed  Prior CABG: No previous CABG      History and Physical Interval Note:  03/04/2020 8:26 AM  Spencer Thomas  has presented today for surgery, with the diagnosis of unstable angina.  The various methods of treatment have been discussed with the patient and family. After consideration of risks, benefits and other options for treatment, the patient has consented to  Procedure(s): LEFT HEART CATH AND CORONARY ANGIOGRAPHY (N/A) as a surgical intervention.  The patient's history has been reviewed, patient examined, no change in status, stable for surgery.  I have reviewed the patient's chart and labs.  Questions were answered to the patient's satisfaction.     Nanetta Batty

## 2020-03-04 NOTE — Progress Notes (Signed)
Progress Note  Patient Name: Spencer Thomas Date of Encounter: 03/04/2020  [facility] Cardiologist: NEW  Subjective   No further CP today.  For LHC later today for workup of CP  Inpatient Medications    Scheduled Meds: . aspirin EC  81 mg Oral Daily  . atorvastatin  80 mg Oral Daily  . metoprolol tartrate  5 mg Intravenous Once  . metoprolol tartrate  12.5 mg Oral BID  . sodium chloride flush  3 mL Intravenous Q12H   Continuous Infusions: . sodium chloride    . sodium chloride 1 mL/kg/hr (03/04/20 0615)  . heparin 1,600 Units/hr (03/04/20 0223)   PRN Meds: sodium chloride, acetaminophen, iohexol, nitroGLYCERIN, ondansetron (ZOFRAN) IV, sodium chloride flush   Vital Signs    Vitals:   03/03/20 1343 03/03/20 2027 03/04/20 0027 03/04/20 0518  BP: 105/67 96/67 122/77 115/76  Pulse: 62 70 62 66  Resp:  18 16 15   Temp: 98 F (36.7 C) (!) 97.5 F (36.4 C) 97.8 F (36.6 C) 97.6 F (36.4 C)  TempSrc: Oral Oral Oral Oral  SpO2: 99% 98% 96% 98%  Weight:    120.2 kg  Height:        Intake/Output Summary (Last 24 hours) at 03/04/2020 0820 Last data filed at 03/04/2020 03/06/2020 Gross per 24 hour  Intake 740 ml  Output --  Net 740 ml   Last 3 Weights 03/04/2020 03/02/2020 03/01/2020  Weight (lbs) 265 lb 263 lb 0.1 oz 265 lb  Weight (kg) 120.203 kg 119.3 kg 120.203 kg      Telemetry    NSR - Personally Reviewed  ECG  No new EKG to review - Personally Reviewed  Physical Exam   GEN: Well nourished, well developed in no acute distress HEENT: Normal NECK: No JVD; No carotid bruits LYMPHATICS: No lymphadenopathy CARDIAC:RRR, no murmurs, rubs, gallops RESPIRATORY:  Clear to auscultation without rales, wheezing or rhonchi  ABDOMEN: Soft, non-tender, non-distended MUSCULOSKELETAL:  No edema; No deformity  SKIN: Warm and dry NEUROLOGIC:  Alert and oriented x 3 PSYCHIATRIC:  Normal affect    Labs    High Sensitivity Troponin:   Recent Labs  Lab 02/04/20 1142  02/04/20 1424 03/01/20 1621 03/01/20 1923  TROPONINIHS 3 3 3 3       Chemistry Recent Labs  Lab 03/02/20 0248 03/03/20 0556 03/04/20 0109  NA 139 140 138  K 3.7 4.1 4.2  CL 105 109 104  CO2 25 24 25   GLUCOSE 123* 109* 136*  BUN 14 15 15   CREATININE 1.14 1.19 1.29*  CALCIUM 8.8* 9.0 9.1  GFRNONAA >60 >60 >60  ANIONGAP 9 7 9      Hematology Recent Labs  Lab 03/02/20 0248 03/03/20 0301 03/04/20 0109  WBC 6.3 6.7 6.8  RBC 5.16 5.03 5.03  HGB 15.0 15.2 14.8  HCT 44.4 43.7 44.2  MCV 86.0 86.9 87.9  MCH 29.1 30.2 29.4  MCHC 33.8 34.8 33.5  RDW 13.3 13.7 13.7  PLT 190 187 200    BNP Recent Labs  Lab 03/02/20 0005  BNP 35.0     DDimer No results for input(s): DDIMER in the last 168 hours.   Radiology    ECHOCARDIOGRAM COMPLETE  Result Date: 03/02/2020    ECHOCARDIOGRAM REPORT   Patient Name:   Spencer Thomas Date of Exam: 03/02/2020 Medical Rec #:  03/05/20    Height:       70.5 in Accession #:    03/06/20   Weight:  263.0 lb Date of Birth:  53-09-1965    BSA:          2.357 m Patient Age:    55 years     BP:           107/74 mmHg Patient Gender: M            HR:           60 bpm. Exam Location:  Inpatient Procedure: 2D Echo Indications:    chest pain  History:        Patient has no prior history of Echocardiogram examinations.                 Risk Factors:Hypertension.  Sonographer:    Delcie Roch Referring Phys: [id number] CARRIEL T NIPP IMPRESSIONS  1. Left ventricular ejection fraction, by estimation, is 60 to 65%. The left ventricle has normal function. The left ventricle has no regional wall motion abnormalities. Left ventricular diastolic parameters are consistent with Grade I diastolic dysfunction (impaired relaxation).  2. Right ventricular systolic function is normal. The right ventricular size is normal. There is normal pulmonary artery systolic pressure.  3. The mitral valve is normal in structure. Trivial mitral valve regurgitation. No evidence of  mitral stenosis.  4. The aortic valve is tricuspid. Aortic valve regurgitation is not visualized. Mild aortic valve sclerosis is present, with no evidence of aortic valve stenosis.  5. There is mild dilatation of the aortic root, measuring 38 mm.  6. The inferior vena cava is normal in size with greater than 50% respiratory variability, suggesting right atrial pressure of 3 mmHg. FINDINGS  Left Ventricle: Left ventricular ejection fraction, by estimation, is 60 to 65%. The left ventricle has normal function. The left ventricle has no regional wall motion abnormalities. The left ventricular internal cavity size was normal in size. There is  no left ventricular hypertrophy. Left ventricular diastolic parameters are consistent with Grade I diastolic dysfunction (impaired relaxation). Right Ventricle: The right ventricular size is normal. No increase in right ventricular wall thickness. Right ventricular systolic function is normal. There is normal pulmonary artery systolic pressure. The tricuspid regurgitant velocity is 1.86 m/s, and  with an assumed right atrial pressure of 3 mmHg, the estimated right ventricular systolic pressure is 16.8 mmHg. Left Atrium: Left atrial size was normal in size. Right Atrium: Right atrial size was normal in size. Pericardium: There is no evidence of pericardial effusion. Mitral Valve: The mitral valve is normal in structure. Trivial mitral valve regurgitation. No evidence of mitral valve stenosis. Tricuspid Valve: The tricuspid valve is normal in structure. Tricuspid valve regurgitation is trivial. No evidence of tricuspid stenosis. Aortic Valve: The aortic valve is tricuspid. Aortic valve regurgitation is not visualized. Mild aortic valve sclerosis is present, with no evidence of aortic valve stenosis. Pulmonic Valve: The pulmonic valve was normal in structure. Pulmonic valve regurgitation is not visualized. No evidence of pulmonic stenosis. Aorta: The aortic root is normal in size and  structure. There is mild dilatation of the aortic root, measuring 38 mm. Venous: The inferior vena cava is normal in size with greater than 50% respiratory variability, suggesting right atrial pressure of 3 mmHg. IAS/Shunts: No atrial level shunt detected by color flow Doppler.  LEFT VENTRICLE PLAX 2D LVIDd:         4.60 cm LVIDs:         3.20 cm LV PW:         1.00 cm LV IVS:  0.90 cm  LV Volumes (MOD) LV vol d, MOD A2C: 69.6 ml LV vol s, MOD A2C: 30.7 ml LV SV MOD A2C:     38.9 ml RIGHT VENTRICLE             IVC RV S prime:     11.10 cm/s  IVC diam: 1.80 cm TAPSE (M-mode): 2.0 cm LEFT ATRIUM             Index       RIGHT ATRIUM           Index LA diam:        3.40 cm 1.44 cm/m  RA Area:     15.20 cm LA Vol (A2C):   49.8 ml 21.12 ml/m RA Volume:   36.10 ml  15.31 ml/m LA Vol (A4C):   46.2 ml 19.60 ml/m LA Biplane Vol: 50.2 ml 21.29 ml/m  AORTIC VALVE LVOT Vmax:   89.40 cm/s LVOT Vmean:  60.000 cm/s LVOT VTI:    0.208 m  AORTA Ao Root diam: 3.80 cm Ao Asc diam:  3.30 cm MITRAL VALVE               TRICUSPID VALVE MV Area (PHT): 3.37 cm    TR Peak grad:   13.8 mmHg MV Decel Time: 225 msec    TR Vmax:        186.00 cm/s MV E velocity: 51.80 cm/s MV A velocity: 70.70 cm/s  SHUNTS MV E/A ratio:  0.73        Systemic VTI: 0.21 m Meztli Llanas MD Electronically signed by Alanna Storti MD Signature Date/Time: 03/02/2020/3:50:33 PM    Final     Cardiac Studies   2D echo  IMPRESSIONS   1. Left ventricular ejection fraction, by estimation, is 60 to 65%. The  left ventricle has normal function. The left ventricle has no regional  wall motion abnormalities. Left ventricular diastolic parameters are  consistent with Grade I diastolic  dysfunction (impaired relaxation).  2. Right ventricular systolic function is normal. The right ventricular  size is normal. There is normal pulmonary artery systolic pressure.  3. The mitral valve is normal in structure. Trivial mitral valve  regurgitation. No evidence  of mitral stenosis.  4. The aortic valve is tricuspid. Aortic valve regurgitation is not  visualized. Mild aortic valve sclerosis is present, with no evidence of  aortic valve stenosis.  5. There is mild dilatation of the aortic root, measuring 38 mm.  6. The inferior vena cava is normal in size with greater than 50%  respiratory variability, suggesting right atrial pressure of 3 mmHg.   Patient Profile     54 y.o. male with history of hypertension and obesity who presents for chest pain.  Assessment & Plan    Chest pain -hsTrop negative x 2 -no further CP since admit -somewhat atypical in that initially it was stabbing but then became a pressure sensation with radiation to both arms.  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   -significant improved with SL NTG and much worse when he exerts himself -CP not worse with deep breathing and not worse lying supine -he had another episode earlier this year and was referred to [facility] for stress echo but this was not completed due to insurance issues -he has had some other episodes of CP in December but not as severe -CRFs include obesity, HTN and newly dx HLD with LDL 190 this admit -tried to get coronary   CTA but could not get HR adequately controlled and then he went into SVT in CT and scan cancelled -I am concerned about the EKG changes along with very high LDL that he could have CAD -Plan is for [facility] today to define coronary anatomy -continue ASA, BB and high dose statin -continue IV heparin gtt  HTN -BP well controlled on exam today -continue Lopressor 12.5mg  BID   -ARB stopped to allow more room to titrate BB for SVT  HLD -LDL 190 -started on Lipitor 80mg  daily -will need repeat FLp and ALT in 6 weeks  SVT -this was transient and no further episodes -cannot discern if atypical atrial flutter vs. AVNRT -CHADS2VASC score is 1 for HTN  -continue low dose BB>>cannot titrate further due to  bradycardia -consider outpt event monitor to assess for further atrial arrhythmias -K+ and Mag normal  I have spent a total of 35 minutes with patient reviewing  telemetry, 2D echo, EKGs, labs and examining patient as well as establishing an assessment and plan that was discussed with the patient.  > 50% of time was spent in direct patient care.    For questions or updates, please contact [facility] Please consult [email or web address] for contact info under        Signed, , MD  03/04/2020, 8:20 AM

## 2020-03-04 NOTE — Discharge Instructions (Signed)
Radial Site Care  This sheet gives you information about how to care for yourself after your procedure. Your health care provider may also give you more specific instructions. If you have problems or questions, contact your health care provider. What can I expect after the procedure? After the procedure, it is common to have:  Bruising and tenderness at the catheter insertion area. Follow these instructions at home: Medicines  Take over-the-counter and prescription medicines only as told by your health care provider. Insertion site care  Follow instructions from your health care provider about how to take care of your insertion site. Make sure you: ? Wash your hands with soap and water before you change your bandage (dressing). If soap and water are not available, use hand sanitizer. ? Change your dressing as told by your health care provider. ? Leave stitches (sutures), skin glue, or adhesive strips in place. These skin closures may need to stay in place for 2 weeks or longer. If adhesive strip edges start to loosen and curl up, you may trim the loose edges. Do not remove adhesive strips completely unless your health care provider tells you to do that.  Check your insertion site every day for signs of infection. Check for: ? Redness, swelling, or pain. ? Fluid or blood. ? Pus or a bad smell. ? Warmth.  Do not take baths, swim, or use a hot tub until your health care provider approves.  You may shower 24-48 hours after the procedure, or as directed by your health care provider. ? Remove the dressing and gently wash the site with plain soap and water. ? Pat the area dry with a clean towel. ? Do not rub the site. That could cause bleeding.  Do not apply powder or lotion to the site. Activity  For 24 hours after the procedure, or as directed by your health care provider: ? Do not flex or bend the affected arm. ? Do not push or pull heavy objects with the affected arm. ? Do not drive  yourself home from the hospital or clinic. You may drive 24 hours after the procedure unless your health care provider tells you not to. ? Do not operate machinery or power tools.  Do not lift anything that is heavier than 10 lb (4.5 kg), or the limit that you are told, until your health care provider says that it is safe.  Ask your health care provider when it is okay to: ? Return to work or school. ? Resume usual physical activities or sports. ? Resume sexual activity.   General instructions  If the catheter site starts to bleed, raise your arm and put firm pressure on the site. If the bleeding does not stop, get help right away. This is a medical emergency.  If you went home on the same day as your procedure, a responsible adult should be with you for the first 24 hours after you arrive home.  Keep all follow-up visits as told by your health care provider. This is important. Contact a health care provider if:  You have a fever.  You have redness, swelling, or yellow drainage around your insertion site. Get help right away if:  You have unusual pain at the radial site.  The catheter insertion area swells very fast.  The insertion area is bleeding, and the bleeding does not stop when you hold steady pressure on the area.  Your arm or hand becomes pale, cool, tingly, or numb. These symptoms may represent a serious  problem that is an emergency. Do not wait to see if the symptoms will go away. Get medical help right away. Call your local emergency services (911 in the [place] Do not drive yourself to the hospital. Summary  After the procedure, it is common to have bruising and tenderness at the site.  Follow instructions from your health care provider about how to take care of your radial site wound. Check the wound every day for signs of infection.  Do not lift anything that is heavier than 10 lb (4.5 kg), or the limit that you are told, until your health care provider says that it  is safe. This information is not intended to replace advice given to you by your health care provider. Make sure you discuss any questions you have with your health care provider. Document Revised: 03/03/2017 Document Reviewed: 03/03/2017 Elsevier Patient Education  2021 Elsevier [employer]. PLEASE REMEMBER TO BRING ALL OF YOUR MEDICATIONS TO EACH OF YOUR FOLLOW-UP OFFICE VISITS.  PLEASE ATTEND ALL SCHEDULED FOLLOW-UP APPOINTMENTS.   Activity: Increase activity slowly as tolerated. You may shower, but no soaking baths (or swimming) for 1 week. No driving for 2 days. No lifting over 5 lbs for 1 week. No sexual activity for 1 week.   You May Return to Work: in 1 week (if applicable)  Wound Care: You may wash cath site gently with soap and water. Keep cath site clean and dry. If you notice pain, swelling, bleeding or pus at your cath site, please call [phone].    Cardiac Cath Site Care Refer to this sheet in the next few weeks. These instructions provide you with information on caring for yourself after your procedure. Your caregiver may also give you more specific instructions. Your treatment has been planned according to current medical practices, but problems sometimes occur. Call your caregiver if you have any problems or questions after your procedure. HOME CARE INSTRUCTIONS  You may shower 24 hours after the procedure. Remove the bandage (dressing) and gently wash the site with plain soap and water. Gently pat the site dry.   Do not apply powder or lotion to the site.   Do not sit in a bathtub, swimming pool, or whirlpool for 5 to 7 days.   No bending, squatting, or lifting anything over 10 pounds (4.5 kg) as directed by your caregiver.   Inspect the site at least twice daily.   Do not drive home if you are discharged the same day of the procedure. Have someone else drive you.   You may drive 24 hours after the procedure unless otherwise instructed by your caregiver.  What to  expect:  Any bruising will usually fade within 1 to 2 weeks.   Blood that collects in the tissue (hematoma) may be painful to the touch. It should usually decrease in size and tenderness within 1 to 2 weeks.  SEEK IMMEDIATE MEDICAL CARE IF:  You have unusual pain at the site or down the affected limb.   You have redness, warmth, swelling, or pain at the site.   You have drainage (other than a small amount of blood on the dressing).   You have chills.   You have a fever or persistent symptoms for more than 72 hours.   You have a fever and your symptoms suddenly get worse.   Your leg becomes pale, cool, tingly, or numb.   You have heavy bleeding from the site. Hold pressure on the site.  Document Released: 02/28/2010 Document Revised: 01/15/2011  Document Reviewed: 02/28/2010 [facility] Patient Information 2012 [place], [place].

## 2020-03-04 NOTE — Progress Notes (Signed)
Patient ID: Spencer Thomas, male   DOB: Mar 31, 1965, 55 y.o.   MRN: [id number] Patient enrolled for [employer] to ship a 30 day cardiac event monitor to his home. Letter with instructions mailed to patient.

## 2020-03-06 ENCOUNTER — Other Ambulatory Visit: Payer: Self-pay | Admitting: *Deleted

## 2020-03-06 NOTE — Patient Outreach (Signed)
[facility]) Care Management  03/06/2020  Spencer Thomas 12-15-1965 [id number]   Transition of care telephone call  Referral received:03/06/20 Initial outreach:03/06/20 Insurance: [facility],   Initial unsuccessful telephone call to patient's preferred number in order to complete transition of care assessment; no answer, left HIPAA compliant voicemail message requesting return call.   Objective: Per the electronic medical record, Spencer Thomas   was hospitalized at [facility] 1/21-1/24/22 for Chest Pain, SVT  . Comorbidities include: Hypertension, hyperlipidemia . He was discharged to home on 04/04/20 without the need for home health services or durable medical equipment per the discharge summary.   Plan: This RNCM will route unsuccessful outreach letter with [facility] Care Management pamphlet and 24 hour Nurse Advice Line Magnet to [employer] Care Management clinical pool to be mailed to patient's home address. This RNCM will attempt another outreach within 4 business days.   Spencer Garibaldi, RN, BSN  [facility] Care Management,Care Management Coordinator  [phone]- Mobile [phone]- Toll Free Main Office

## 2020-03-11 ENCOUNTER — Other Ambulatory Visit: Payer: Self-pay | Admitting: *Deleted

## 2020-03-11 NOTE — Patient Outreach (Signed)
[facility]) Care Management  03/11/2020  Spencer Thomas July 27, 1965 [id number]   Transition of care call Referral received: 03/06/20 Initial outreach attempt: 03/06/20 Insurance: [employer]    2nd unsuccessful telephone call to patient's preferred contact number in order to complete post hospital discharge transition of care assessment , no answer left HIPAA compliant message requesting return call.    Objective:  Per the electronic medical record, [name]   was hospitalized at [facility] 1/21-1/24/22 for Chest Pain, SVT  . Comorbidities include: Hypertension, hyperlipidemia . He was discharged to home on 03/04/20 without the need for home health services or durable medical equipment per the discharge summary.   Plan If no return call from patient will attempt 3rd outreach in the next 4 business days.    Egbert Garibaldi, RN, BSN  [facility] Care Management,Care Management Coordinator  [phone]- Mobile [phone]- Toll Free Main Office

## 2020-03-12 ENCOUNTER — Encounter: Payer: Self-pay | Admitting: Cardiology

## 2020-03-12 ENCOUNTER — Ambulatory Visit (INDEPENDENT_AMBULATORY_CARE_PROVIDER_SITE_OTHER): Payer: 59

## 2020-03-12 DIAGNOSIS — I471 Supraventricular tachycardia: Secondary | ICD-10-CM | POA: Diagnosis not present

## 2020-03-14 ENCOUNTER — Other Ambulatory Visit: Payer: Self-pay | Admitting: *Deleted

## 2020-03-14 NOTE — Patient Outreach (Signed)
[facility]) Care Management  03/14/2020  Spencer Thomas 1965/12/22 [id number]   Transition of care call Referral received: 03/06/20 Initial outreach attempt: 03/06/20 Insurance: [employer]  Third unsuccessful telephone call to patient's preferred contact number in order to complete post hospital discharge transition of care assessment; no answer, left HIPAA compliant message requesting return call.   Objective: Per the electronic medical record, [name] hospitalized at [facility] 1/21-1/24/22 for Chest Pain, SVT. Comorbidities include: Hypertension, hyperlipidemia .He was discharged to home on 1/24/22without the need for home health services or durable medical equipment per the discharge summary.    Plan: If no return call from patient, will plan 4th call attempt in the next 3 weeks.    Egbert Garibaldi, RN, BSN  [facility] Care Management,Care Management Coordinator  [phone]- Mobile [phone]- Toll Free Main Office

## 2020-03-19 ENCOUNTER — Telehealth: Payer: Self-pay | Admitting: *Deleted

## 2020-03-19 DIAGNOSIS — R0683 Snoring: Secondary | ICD-10-CM

## 2020-03-19 DIAGNOSIS — I455 Other specified heart block: Secondary | ICD-10-CM

## 2020-03-19 NOTE — Telephone Encounter (Addendum)
[employer] cardiac report 03/19/2020  1:26 am Auto trigger; Sinus bradycardia, pause (4 seconds) with 1 st degree AV block .  Left voice mail to call back.   Continue to monitor and attempt to contact patient to confirm he was sleeping. Ask about snoring and sleeping habits. IF positive for possible sleep apnea send in evaluation orders. Signed by DOD Dr. Delton See. Placed in monitor bin and sent to triage for follow up.   Spoke to patient 03/20/20: the patient was sleeping at the time of pause. He does snore and jumps himself awake in his sleep. Patient is agreeable to home sleep study. Sleep evaluation score 16.

## 2020-03-20 ENCOUNTER — Telehealth: Payer: Self-pay | Admitting: *Deleted

## 2020-03-20 ENCOUNTER — Ambulatory Visit: Payer: PRIVATE HEALTH INSURANCE | Admitting: Cardiology

## 2020-03-20 NOTE — Telephone Encounter (Signed)
Spencer Thomas Please find out if patient needs an OV to discuss before ordering sleep study or can we go ahead and order sleep study for snoring, frequent awakenings at night and excessive daytime sleepiness with nocturnal bradycardia.  I would like this to be an in lab PSG  [name]

## 2020-03-20 NOTE — Telephone Encounter (Signed)
I have not met this patient before or been involved in their care. Will route to ordering provider so they are aware.

## 2020-03-20 NOTE — Telephone Encounter (Signed)
-----   Message from Sampson Goon, RN sent at 03/20/2020  9:10 AM EST ----- Regarding: home sleep The patient was ordered a home sleep study for snoring and sinus pause while sleeping  Thank you  Inetta Fermo

## 2020-03-28 ENCOUNTER — Other Ambulatory Visit: Payer: Self-pay | Admitting: *Deleted

## 2020-03-28 ENCOUNTER — Telehealth: Payer: Self-pay | Admitting: *Deleted

## 2020-03-28 NOTE — Patient Outreach (Signed)
[facility]) Care Management  03/28/2020  Spencer Thomas 05-Sep-1965 [id number]   Transition of care /Case Closure Unsuccessful outreach    Referral received:03/06/20 Initial outreach:03/06/20 Insurance: UMR    Unable to complete post hospital discharge transition of care assessment. No return call form patient after 3 call attempts and no response to request to contact RN Care Coordinator in unsuccessful outreach letter mailed to home on 1/26//22.  Objective: Per the electronic medical record, [name] hospitalized at [facility] 1/21-1/24/22 for Chest Pain, SVT. Comorbidities include: Hypertension, hyperlipidemia .He was discharged to home on1/24/22without the need for home health services or durable medical equipment per the discharge  Plan Case closed to [facility] care management services , unsuccessful call attempt x 4 no return response.    Spencer Garibaldi, RN, BSN  [facility] Care Management,Care Management Coordinator  [phone]- Mobile [phone]- Toll Free Main Office

## 2020-03-28 NOTE — Telephone Encounter (Signed)
Staff message sent to Coralee North per her confirmation from speaking to the patient that he has [facility] insurance ok to schedule HST. No PA is required.

## 2020-04-04 NOTE — Telephone Encounter (Signed)
Patient is aware and agreeable to Home Sleep Study through [facility]. Patient is scheduled for 05-03-20 at 11 to pick up home sleep kit and meet with Respiratory therapist at [facility]. Patient is aware that if this appointment date and time does not work for them they should contact Artis Delay directly at [phone]. Patient is aware that a sleep packet will be sent from [facility] in week. Patient is agreeable to treatment and thankful for call.

## 2020-04-04 NOTE — Telephone Encounter (Signed)
Patients Insurance Info [name], [place]  Spencer Thomas, CMA No PA is required for [employer]. Ok to schedule HST.    From: Spencer Thomas, CMA  Sent: 03/27/2020  4:00 PM EST  To: Gaynelle Cage, CMA  Subject: Patients Insurance Info              /F [name], [name] [[id number]] [employer]  Add Coverage     Guarantor Demographics Address linked to patient Home: [phone] Rel to patient: Self   Work: [phone]  Additional Guarantor Info Employment:[employer]  Prof acct balance: 635.17 Hosp acct balance: [id number] Add Guarantor Note      1. E-MOSES CON*/[place] UMR    [id number]  Elapsed Response History      Subscriber Demographics [name],[name] Home: [phone]   [address] [place], [place] [address] Work: [phone]     Coverage Info Member ID: [id number] Group: [place] - 7* Rel to subscriber: Self  Subscriber ID: [id number] Effective from: 02/10/2016 Auth phone:    ----- Message -----  From: Gaynelle Cage, CMA  Sent: 03/21/2020 10:31 AM EST  To: Spencer Thomas, CMA  Subject: RE: home sleep

## 2020-04-18 ENCOUNTER — Telehealth: Payer: Self-pay

## 2020-04-18 DIAGNOSIS — I455 Other specified heart block: Secondary | ICD-10-CM

## 2020-04-18 DIAGNOSIS — R0683 Snoring: Secondary | ICD-10-CM

## 2020-04-18 NOTE — Telephone Encounter (Signed)
Dr. Mayford Knife would like for patient to have an in-lab sleep study rather than home sleep study.

## 2020-04-18 NOTE — Telephone Encounter (Signed)
-----   Message from Quintella Reichert, MD sent at 04/17/2020  6:40 PM EST ----- I would prefer in lab study due to nocturna bradycardia and bradyarrhythmias ----- Message ----- From: Theresia Majors, RN Sent: 04/17/2020   5:08 PM EST To: Quintella Reichert, MD  The patient has been notified of the result and verbalized understanding.  All questions (if any) were answered. Theresia Majors, RN 04/17/2020 5:05 PM   Patient was supposed to be scheduled for an in-lab PSG sleep study after a monitor report was received from [facility] on 2/8. However, home sleep study was ordered instead and he is scheduled to pick it up 3/25.  Is home sleep study okay or does this need to be changed to in-lab PSG?

## 2020-04-22 ENCOUNTER — Encounter: Payer: Self-pay | Admitting: Physician Assistant

## 2020-04-22 NOTE — Progress Notes (Signed)
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 He was recently admitted 02/2020 with sharp chest pain and negative troponins. Coronary CT was considered but aborted due to HR too fast - went into SVT right before getting IV Lopressor. Underwent cath with no significant CAD. Chest pain resolved. Chest pain was felt noncardiac and his PPI was increased. He was started on Toprol for evaluation of SVT and set up for event monitor. The event monitor did show autotriggering for nocturnal sinus bradycardia with a 4 sec pause and 1st degree AVB, therefore set up for sleep study which is pending. Otherwise showed average HR 77, range 46-131, and blocked PACs.  He is seen back in follow-up and overall is doing well. He noticed feeling very sluggish on metoprolol so stopped this about a week ago. He also has had some rare leg pains which he wonders may be due to the atorvastatin. In retrospect he has had symptoms similar to SVT about 1x/month realizing this has been going on for quite some time now that he knows that they feel like. The episodes about 5 minutes. His last one was quickly aborted with what sounds like Valsalva maneuver.   Labwork independently reviewed: 02/2020 K 4.2, Cr 1.29, Mg 2.1, LDL 190, Trig 63, HDL 40, V9D 5, TSH wnl 01/2020 CBC wnl   Past Medical History:  Diagnosis Date  . Bradycardia    nocturnal  . CKD (chronic kidney disease), stage II   . HTN (hypertension)   . Hyperlipemia   . PSVT (paroxysmal supraventricular tachycardia) (HCC)     Past Surgical History:  Procedure  Laterality Date  . APPENDECTOMY    . CORONARY ANGIOGRAPHY N/A 03/04/2020   Procedure: CORONARY ANGIOGRAPHY (CATH LAB);  Surgeon: Runell Gess, MD;  Location: [facility] INVASIVE CV LAB;  Service: Cardiovascular;  Laterality: N/A;    Current Medications: Current Meds  Medication Sig  . atorvastatin (LIPITOR) 40 MG tablet Take 1 tablet (40 mg total) by mouth daily.  . Magnesium 100 MG TABS Take 100 mg by mouth daily.  . metoprolol succinate (TOPROL XL) 25 MG 24 hr tablet Take 0.5 tablets (12.5 mg total) by mouth daily.  . Multiple Vitamin (MULTI VITAMIN DAILY PO) Take 1 tablet by mouth daily.  . pantoprazole (PROTONIX) 40 MG tablet Take 40 mg by mouth as needed.  . [DISCONTINUED] atorvastatin (LIPITOR) 80 MG tablet Take 1 tablet (80 mg total) by mouth daily.  . [DISCONTINUED] metoprolol succinate (TOPROL XL) 25 MG 24 hr tablet Take 1 tablet (25 mg total) by mouth daily. Take with or immediately following a meal.  . [DISCONTINUED] naproxen (NAPROSYN) 375 MG tablet Take 375 mg by mouth as needed.      Allergies:   Patient has no known allergies.   Social History   Socioeconomic History  . Marital status: Single    Spouse name: Not on file  . Number of children: Not on file  . Years of education: Not on file  . Highest education level: Not on file  Occupational History  . Not  on file  Tobacco Use  . Smoking status: Never Smoker  . Smokeless tobacco: Never Used  Vaping Use  . Vaping Use: Never used  Substance and Sexual Activity  . Alcohol use: Not Currently  . Drug use: Never  . Sexual activity: Not on file  Other Topics Concern  . Not on file  Social History Narrative  . Not on file   Social Determinants of Health   Financial Resource Strain: Not on file  Food Insecurity: Not on file  Transportation Needs: Not on file  Physical Activity: Not on file  Stress: Not on file  Social Connections: Not on file     Family History:  The patient's family history includes  Diabetes in his brother and mother. There is no history of CAD.  ROS:    Please see the history of present illness. Some rare transient leg pains. No claudication. Feeling well today. All other systems are reviewed and otherwise negative.    EKGs/Labs/Other Studies Reviewed:    Studies reviewed are outlined and summarized above. Reports included below if pertinent.  Cardiac Cath 03/04/20  History obtained from chart review.54 y.o.malewith history of hypertension, hyperlipidemia and obesity who presents for chest pain.  He was initially scheduled for a coronary CTA however he developed SVT and was unable to be slowed down.  The decision was then made to proceed with diagnostic coronary angiography.  His enzymes were negative.  His initial EKG did show nonspecific changes.   IMPRESSION: Mr. Parmelee has normal coronary arteries by angiography and normal LV function by echo with negative enzymes.  I believe his chest pain is noncardiac.  The sheath was removed and a TR band was placed on the right wrist to achieve patent hemostasis.  The patient left lab in stable condition.  Critical structure modification including treatment for hypertension hyperlipidemia will be pursued.  Dr. Mayford Knife was notified of these results.  Nanetta Batty. MD, [facility] 03/04/2020 9:21 AM  2D echo 03/02/20 1. Left ventricular ejection fraction, by estimation, is 60 to 65%. The  left ventricle has normal function. The left ventricle has no regional  wall motion abnormalities. Left ventricular diastolic parameters are  consistent with Grade I diastolic  dysfunction (impaired relaxation).  2. Right ventricular systolic function is normal. The right ventricular  size is normal. There is normal pulmonary artery systolic pressure.  3. The mitral valve is normal in structure. Trivial mitral valve  regurgitation. No evidence of mitral stenosis.  4. The aortic valve is tricuspid. Aortic valve regurgitation is not   visualized. Mild aortic valve sclerosis is present, with no evidence of  aortic valve stenosis.  5. There is mild dilatation of the aortic root, measuring 38 mm.  6. The inferior vena cava is normal in size with greater than 50%  respiratory variability, suggesting right atrial pressure of 3 mmHg.   Monitor finalized 04/2020  Predominant rhythm was normal sinus rhythm with average heart rate 77bpm and ranged from 46 to 131bpm.  Sinus pause for 4 seconds  Blocked PACs        EKG:  EKG is not ordered today  Recent Labs: 03/02/2020: B Natriuretic Peptide 35.0; TSH 1.616 03/03/2020: Magnesium 2.1 03/04/2020: BUN 15; Creatinine, Ser 1.29; Hemoglobin 14.8; Platelets 200; Potassium 4.2; Sodium 138  Recent Lipid Panel    Component Value Date/Time   CHOL 243 (H) 03/02/2020 0248   TRIG 63 03/02/2020 0248   HDL 40 (L) 03/02/2020 0248   CHOLHDL 6.1 03/02/2020 0248  VLDL 13 03/02/2020 0248   LDLCALC 190 (H) 03/02/2020 0248    PHYSICAL EXAM:    VS:  BP 118/80   Pulse 81   Ht 5\' 10"  (1.778 m)   Wt 266 lb (120.7 kg)   SpO2 97%   BMI 38.17 kg/m   BMI: Body mass index is 38.17 kg/m.  GEN: Well nourished, well developed male in no acute distress HEENT: normocephalic, atraumatic Neck: no JVD, carotid bruits, or masses Cardiac: RRR; no murmurs, rubs, or gallops, no edema  Respiratory:  clear to auscultation bilaterally, normal work of breathing GI: soft, nontender, nondistended, + BS MS: no deformity or atrophy Skin: warm and dry, no rash, right radial cath site without hematoma or ecchymosis; good pulse. Neuro:  Alert and Oriented x 3, Strength and sensation are intact, follows commands Psych: euthymic mood, full affect  Wt Readings from Last 3 Encounters:  04/24/20 266 lb (120.7 kg)  03/04/20 265 lb (120.2 kg)  02/04/20 260 lb (117.9 kg)     ASSESSMENT & PLAN:   1. PSVT - did not tolerate Toprol XL 25mg  daily, will try to re-challenge with 12.5mg  daily and see how he  does. Diltiazem is also a consideration, just have to be cognizant of tendency for nocturnal bradycardia and tendency for low-normal BP in the past. TSH was normal recently. Also undergoing w/u for OSA. I discussed referral to EP for consideration of SVT ablation (vs discussion of medical therapy). He is interested in this so will place referral. 2. Nocturnal bradycardia - sleep study pending for sleep apnea eval. Does report daytime fatigue. Await sleep study. Avoid aggressive titration of AVN blocking agents. 3. CKD stage II - discussed finding with patient. Advised to avoid NSAIDS and f/u PCP. 4. Hyperlipidemia - some muscle aches on atorvastatin, very mild, transient. May not be related to medication but will decrease atorvastatin dose to 40mg  daily given normal coronaries on cath. Lifestyle modifications recommended as well with fasting lipid profile and CMET in about 2 months.  Disposition: F/u with Dr. 02/06/20 or general cardiology APP in 6 months. Refer to EP as above.  Medication Adjustments/Labs and Tests Ordered: Current medicines are reviewed at length with the patient today.  Concerns regarding medicines are outlined above. Medication changes, Labs and Tests ordered today are summarized above and listed in the Patient Instructions accessible in Encounters.   Signed, , PA-C  04/24/2020 9:53 AM    [facility] [address] [place], [place], [address]  [place] Phone: [phone]; Fax: [phone]

## 2020-04-24 ENCOUNTER — Encounter: Payer: Self-pay | Admitting: Physician Assistant

## 2020-04-24 ENCOUNTER — Other Ambulatory Visit: Payer: Self-pay

## 2020-04-24 ENCOUNTER — Ambulatory Visit: Payer: 59 | Admitting: Physician Assistant

## 2020-04-24 VITALS — BP 118/80 | HR 81 | Ht 70.0 in | Wt 266.0 lb

## 2020-04-24 DIAGNOSIS — E785 Hyperlipidemia, unspecified: Secondary | ICD-10-CM | POA: Diagnosis not present

## 2020-04-24 DIAGNOSIS — I471 Supraventricular tachycardia: Secondary | ICD-10-CM | POA: Diagnosis not present

## 2020-04-24 DIAGNOSIS — N182 Chronic kidney disease, stage 2 (mild): Secondary | ICD-10-CM

## 2020-04-24 DIAGNOSIS — R001 Bradycardia, unspecified: Secondary | ICD-10-CM | POA: Diagnosis not present

## 2020-04-24 MED ORDER — METOPROLOL SUCCINATE ER 25 MG PO TB24: 12.5000 mg | ORAL_TABLET | Freq: Every day | ORAL | 3 refills | Status: DC

## 2020-04-24 MED ORDER — ATORVASTATIN CALCIUM 40 MG PO TABS: 40.0000 mg | ORAL_TABLET | Freq: Every day | ORAL | 3 refills | Status: DC

## 2020-04-24 NOTE — Patient Instructions (Addendum)
Medication Instructions:  Your physician has recommended you make the following change in your medication:  1.  REDUCE the Metoprolol to 25 mg taking 1/2 tablet a day 2.  REDUCE the Atorvastatin to 40 mg daily.  You may take 1/2 of the 80 mg tablet to use them up.  I have sent in a new prescription for the new 40 mg dosage to your Pharmacy.   *If you need a refill on your cardiac medications before your next appointment, please call your pharmacy*   Lab Work: 2 MONTHS:  FASTING LIPID & CMET  If you have labs (blood work) drawn today and your tests are completely normal, you will receive your results only by: Marland Kitchen MyChart Message (if you have MyChart) OR . A paper copy in the mail If you have any lab test that is abnormal or we need to change your treatment, we will call you to review the results.   Testing/Procedures: None ordered   Follow-Up: At [facility], you and your health needs are our priority.  As part of our continuing mission to provide you with exceptional heart care, we have created designated Provider Care Teams.  These Care Teams include your primary Cardiologist (physician) and Advanced Practice Providers (APPs -  Physician Assistants and Nurse Practitioners) who all work together to provide you with the care you need, when you need it.  We recommend signing up for the patient portal called "MyChart".  Sign up information is provided on this After Visit Summary.  MyChart is used to connect with patients for Virtual Visits (Telemedicine).  Patients are able to view lab/test results, encounter notes, upcoming appointments, etc.  Non-urgent messages can be sent to your provider as well.   To learn more about what you can do with MyChart, go to [email or web address].    Your next appointment:   6 month(s)  The format for your next appointment:   In Person  Provider:   You may see Armanda Magic, MD or one of the following Advanced Practice Providers on your designated  Care Team:    Ronie Spies, PA-C  Jacolyn Reedy, PA-C    Other Instructions  Your kidney function appears to have been mildly elevated by labwork in the hospital and even looks this way back in December as well (last creatinine 1.29). Patients with kidney issues should generally stay away from medicines like ibuprofen, Advil, Motrin, naproxen, and Aleve due to risk of worsening kidney function. You may take Tylenol as directed or talk to primary doctor about alternatives for pain issues.

## 2020-04-25 NOTE — Telephone Encounter (Signed)
PSG study scheduled.

## 2020-04-25 NOTE — Telephone Encounter (Signed)
No OV needed will order in lab study.

## 2020-05-01 NOTE — Telephone Encounter (Signed)
Message from Quintella Reichert, MD sent at 04/17/2020  6:40 PM EST   I would prefer in lab study due to nocturna bradycardia and bradyarrhythmias.  Patient is scheduled for lab study on 06/26/20. Patient understands his sleep study will be done at [facility] sleep lab. Patient understands he will receive a sleep packet in a week or so. Patient understands to call if he does not receive the sleep packet in a timely manner. Patient agrees with treatment and thanked me for call. Left detailed message on voicemail with date and time of titration and informed patient to call back to confirm or reschedule.

## 2020-05-03 ENCOUNTER — Encounter (HOSPITAL_BASED_OUTPATIENT_CLINIC_OR_DEPARTMENT_OTHER): Payer: 59 | Admitting: Cardiology

## 2020-05-06 ENCOUNTER — Encounter: Payer: Self-pay | Admitting: Internal Medicine

## 2020-05-06 ENCOUNTER — Other Ambulatory Visit: Payer: Self-pay

## 2020-05-06 ENCOUNTER — Ambulatory Visit: Payer: 59 | Admitting: Internal Medicine

## 2020-05-06 ENCOUNTER — Encounter: Payer: Self-pay | Admitting: *Deleted

## 2020-05-06 VITALS — BP 116/74 | HR 69 | Ht 70.0 in | Wt 268.2 lb

## 2020-05-06 DIAGNOSIS — I471 Supraventricular tachycardia: Secondary | ICD-10-CM | POA: Diagnosis not present

## 2020-05-06 NOTE — Progress Notes (Signed)
Electrophysiology Office Note   Date:  05/06/2020   ID:  Spencer Thomas, DOB 03/16/1965, MRN [id number]  PCP:  Maudie Flakes, FNP    Primary Electrophysiologist: Hillis Range, MD    CC: SVT   History of Present Illness: Spencer Thomas is a 55 y.o. male who presents today for electrophysiology evaluation.   He is referred by Dr Mayford Knife and Ronie Spies for SVT evaluation. He was admitted with chest pain 1/22.  In retrospect, he thinks that he had SVT as the cause.  He did have documented SVT per reports during his cardiac CT (though I do not have strips to review).  He reports abrupt onset/ offset tachypalpitations for about 5 years.  He finds that exercise and bending/ twisting can be triggers.  He has recently found that vagal maneuvers are effective in terminated events. He has been prescribed toprol which has not been effective.  He has had fatigue with this medicines.  He has had nocturnal pauses also.  Today, he denies symptoms of palpitations, chest pain, shortness of breath, orthopnea, PND, lower extremity edema, claudication, dizziness, presyncope, syncope, bleeding, or neurologic sequela. The patient is tolerating medications without difficulties and is otherwise without complaint today.    Past Medical History:  Diagnosis Date  . Bradycardia    nocturnal  . CKD (chronic kidney disease), stage II   . HTN (hypertension)   . Hyperlipemia   . PSVT (paroxysmal supraventricular tachycardia) (HCC)    Past Surgical History:  Procedure Laterality Date  . APPENDECTOMY    . CORONARY ANGIOGRAPHY N/A 03/04/2020   Procedure: CORONARY ANGIOGRAPHY (CATH LAB);  Surgeon: Runell Gess, MD;  Location: [facility] INVASIVE CV LAB;  Service: Cardiovascular;  Laterality: N/A;     Current Outpatient Medications  Medication Sig Dispense Refill  . atorvastatin (LIPITOR) 40 MG tablet Take 1 tablet (40 mg total) by mouth daily. 90 tablet 3  . Magnesium 100 MG TABS Take 100 mg by mouth daily.    .  Multiple Vitamin (MULTI VITAMIN DAILY PO) Take 1 tablet by mouth daily.    . pantoprazole (PROTONIX) 40 MG tablet Take 40 mg by mouth as needed.     No current facility-administered medications for this visit.    Allergies:   Patient has no known allergies.   Social History:  The patient  reports that he has never smoked. He has never used smokeless tobacco. He reports previous alcohol use. He reports that he does not use drugs.   Family History:  The patient's family history includes Diabetes in his brother and mother.    ROS:  Please see the history of present illness.   All other systems are personally reviewed and negative.    PHYSICAL EXAM: VS:  BP 116/74   Pulse 69   Ht 5\' 10"  (1.778 m)   Wt 268 lb 3.2 oz (121.7 kg)   SpO2 98%   BMI 38.48 kg/m  , BMI Body mass index is 38.48 kg/m. GEN: Well nourished, well developed, in no acute distress HEENT: normal Neck: no JVD, carotid bruits, or masses Cardiac: RRR; no murmurs, rubs, or gallops,no edema  Respiratory:  clear to auscultation bilaterally, normal work of breathing GI: soft, nontender, nondistended, + BS MS: no deformity or atrophy Skin: warm and dry  Neuro:  Strength and sensation are intact Psych: euthymic mood, full affect  EKG:  EKG is ordered today. The ekg ordered today is personally reviewed and shows sinus rhythm   Recent  Labs: 03/02/2020: B Natriuretic Peptide 35.0; TSH 1.616 03/03/2020: Magnesium 2.1 03/04/2020: BUN 15; Creatinine, Ser 1.29; Hemoglobin 14.8; Platelets 200; Potassium 4.2; Sodium 138  personally reviewed   Lipid Panel     Component Value Date/Time   CHOL 243 (H) 03/02/2020 0248   TRIG 63 03/02/2020 0248   HDL 40 (L) 03/02/2020 0248   CHOLHDL 6.1 03/02/2020 0248   VLDL 13 03/02/2020 0248   LDLCALC 190 (H) 03/02/2020 0248   personally reviewed   Wt Readings from Last 3 Encounters:  05/06/20 268 lb 3.2 oz (121.7 kg)  04/24/20 266 lb (120.7 kg)  03/04/20 265 lb (120.2 kg)       Other studies personally reviewed: Additional studies/ records that were reviewed today include: Dayna Dunns notes, prior ekgs,   Review of the above records today demonstrates: as above   ASSESSMENT AND PLAN:  1.  SVT Therapeutic strategies for supraventricular tachycardia including medicine and ablation were discussed in detail with the patient today. Risk, benefits, and alternatives to EP study and radiofrequency ablation were also discussed in detail today. These risks include but are not limited to stroke, bleeding, vascular damage, tamponade, perforation, damage to the heart and other structures, AV block requiring pacemaker, worsening renal function, and death. The patient understands these risk and wishes to proceed.  We will therefore proceed with catheter ablation at the next available time.   Stop metoprolol at this time   Signed, Hillis Range, MD  05/06/2020 3:02 PM     [facility] [address] [place] [address] [phone] (office) [phone] (fax)

## 2020-05-06 NOTE — Patient Instructions (Addendum)
Medication Instructions:  Stop Metoprolol Succinate Your physician recommends that you continue on your current medications as directed. Please refer to the Current Medication list given to you today.  Labwork: None ordered.  Testing/Procedures: Your physician has recommended that you have an ablation. Catheter ablation is a medical procedure used to treat some cardiac arrhythmias (irregular heartbeats). During catheter ablation, a long, thin, flexible tube is put into a blood vessel in your groin (upper thigh), or neck. This tube is called an ablation catheter. It is then guided to your heart through the blood vessel. Radio frequency waves destroy small areas of heart tissue where abnormal heartbeats may cause an arrhythmia to start. Please see the instruction sheet given to you today.    Any Other Special Instructions Will Be Listed Below (If Applicable).  If you need a refill on your cardiac medications before your next appointment, please call your pharmacy.    Cardiac Ablation Cardiac ablation is a procedure to destroy (ablate) some heart tissue that is sending bad signals. These bad signals cause problems in heart rhythm. The heart has many areas that make these signals. If there are problems in these areas, they can make the heart beat in a way that is not normal. Destroying some tissues can help make the heart rhythm normal. Tell your doctor about:  Any allergies you have.  All medicines you are taking. These include vitamins, herbs, eye drops, creams, and over-the-counter medicines.  Any problems you or family members have had with medicines that make you fall asleep (anesthetics).  Any blood disorders you have.  Any surgeries you have had.  Any medical conditions you have, such as kidney failure.  Whether you are pregnant or may be pregnant. What are the risks? This is a safe procedure. But problems may occur, including:  Infection.  Bruising and  bleeding.  Bleeding into the chest.  Stroke or blood clots.  Damage to nearby areas of your body.  Allergies to medicines or dyes.  The need for a pacemaker if the normal system is damaged.  Failure of the procedure to treat the problem. What happens before the procedure? Medicines Ask your doctor about:  Changing or stopping your normal medicines. This is important.  Taking aspirin and ibuprofen. Do not take these medicines unless your doctor tells you to take them.  Taking other medicines, vitamins, herbs, and supplements. General instructions  Follow instructions from your doctor about what you cannot eat or drink.  Plan to have someone take you home from the hospital or clinic.  If you will be going home right after the procedure, plan to have someone with you for 24 hours.  Ask your doctor what steps will be taken to prevent infection. What happens during the procedure?  An IV tube will be put into one of your veins.  You will be given a medicine to help you relax.  The skin on your neck or groin will be numbed.  A cut (incision) will be made in your neck or groin. A needle will be put through your cut and into a large vein.  A tube (catheter) will be put into the needle. The tube will be moved to your heart.  Dye may be put through the tube. This helps your doctor see your heart.  Small devices (electrodes) on the tube will send out signals.  A type of energy will be used to destroy some heart tissue.  The tube will be taken out.  Pressure will be  held on your cut. This helps stop bleeding.  A bandage will be put over your cut. The exact procedure may vary among doctors and hospitals.   What happens after the procedure?  You will be watched until you leave the hospital or clinic. This includes checking your heart rate, breathing rate, oxygen, and blood pressure.  Your cut will be watched for bleeding. You will need to lie still for a few hours.  Do  not drive for 24 hours or as long as your doctor tells you. Summary  Cardiac ablation is a procedure to destroy some heart tissue. This is done to treat heart rhythm problems.  Tell your doctor about any medical conditions you may have. Tell him or her about all medicines you are taking to treat them.  This is a safe procedure. But problems may occur. These include infection, bruising, bleeding, and damage to nearby areas of your body.  Follow what your doctor tells you about food and drink. You may also be told to change or stop some of your medicines.  After the procedure, do not drive for 24 hours or as long as your doctor tells you. This information is not intended to replace advice given to you by your health care provider. Make sure you discuss any questions you have with your health care provider. Document Revised: 12/29/2018 Document Reviewed: 12/29/2018 Elsevier Patient Education  2021 [employer].

## 2020-05-28 ENCOUNTER — Other Ambulatory Visit: Payer: 59 | Admitting: *Deleted

## 2020-05-28 ENCOUNTER — Other Ambulatory Visit: Payer: Self-pay

## 2020-05-28 DIAGNOSIS — I471 Supraventricular tachycardia: Secondary | ICD-10-CM

## 2020-05-28 LAB — CBC WITH DIFFERENTIAL/PLATELET
Basophils Absolute: 0.1 10*3/uL (ref 0.0–0.2)
Basos: 1 %
EOS (ABSOLUTE): 0.2 10*3/uL (ref 0.0–0.4)
Eos: 3 %
Hematocrit: 45.6 % (ref 37.5–51.0)
Hemoglobin: 15.7 g/dL (ref 13.0–17.7)
Immature Grans (Abs): 0 10*3/uL (ref 0.0–0.1)
Immature Granulocytes: 0 %
Lymphocytes Absolute: 1.9 10*3/uL (ref 0.7–3.1)
Lymphs: 26 %
MCH: 29.8 pg (ref 26.6–33.0)
MCHC: 34.4 g/dL (ref 31.5–35.7)
MCV: 87 fL (ref 79–97)
Monocytes Absolute: 0.9 10*3/uL (ref 0.1–0.9)
Monocytes: 12 %
Neutrophils Absolute: 4.4 10*3/uL (ref 1.4–7.0)
Neutrophils: 58 %
Platelets: 202 10*3/uL (ref 150–450)
RBC: 5.26 x10E6/uL (ref 4.14–5.80)
RDW: 13.1 % (ref 11.6–15.4)
WBC: 7.6 10*3/uL (ref 3.4–10.8)

## 2020-05-28 LAB — BASIC METABOLIC PANEL
BUN/Creatinine Ratio: 8 — ABNORMAL LOW (ref 9–20)
BUN: 12 mg/dL (ref 6–24)
CO2: 26 mmol/L (ref 20–29)
Calcium: 9.4 mg/dL (ref 8.7–10.2)
Chloride: 100 mmol/L (ref 96–106)
Creatinine, Ser: 1.44 mg/dL — ABNORMAL HIGH (ref 0.76–1.27)
Glucose: 106 mg/dL — ABNORMAL HIGH (ref 65–99)
Potassium: 4.1 mmol/L (ref 3.5–5.2)
Sodium: 139 mmol/L (ref 134–144)
eGFR: 58 mL/min/{1.73_m2} — ABNORMAL LOW (ref >59)

## 2020-06-18 ENCOUNTER — Other Ambulatory Visit (HOSPITAL_COMMUNITY)
Admission: RE | Admit: 2020-06-18 | Discharge: 2020-06-18 | Disposition: A | Discharge status: Home / Self Care | Payer: 59 | Source: Ambulatory Visit | Attending: Internal Medicine | Admitting: Internal Medicine

## 2020-06-18 DIAGNOSIS — Z01812 Encounter for preprocedural laboratory examination: Secondary | ICD-10-CM | POA: Diagnosis not present

## 2020-06-18 DIAGNOSIS — Z20822 Contact with and (suspected) exposure to covid-19: Secondary | ICD-10-CM | POA: Diagnosis not present

## 2020-06-18 LAB — SARS CORONAVIRUS 2 (TAT 6-24 HRS): SARS Coronavirus 2: NEGATIVE

## 2020-06-20 ENCOUNTER — Ambulatory Visit (HOSPITAL_COMMUNITY): Payer: 59 | Admitting: Anesthesiology

## 2020-06-20 ENCOUNTER — Encounter (HOSPITAL_COMMUNITY): Payer: Self-pay | Admitting: Internal Medicine

## 2020-06-20 ENCOUNTER — Ambulatory Visit (HOSPITAL_COMMUNITY)
Admission: RE | Admit: 2020-06-20 | Discharge: 2020-06-20 | Disposition: A | Discharge status: Home / Self Care | Payer: 59 | Attending: Internal Medicine | Admitting: Internal Medicine

## 2020-06-20 ENCOUNTER — Encounter (HOSPITAL_COMMUNITY)
Admission: RE | Disposition: A | Discharge status: Home / Self Care | Payer: Self-pay | Source: Home / Self Care | Attending: Internal Medicine

## 2020-06-20 DIAGNOSIS — N182 Chronic kidney disease, stage 2 (mild): Secondary | ICD-10-CM | POA: Diagnosis not present

## 2020-06-20 DIAGNOSIS — E785 Hyperlipidemia, unspecified: Secondary | ICD-10-CM | POA: Diagnosis not present

## 2020-06-20 DIAGNOSIS — Z79899 Other long term (current) drug therapy: Secondary | ICD-10-CM | POA: Insufficient documentation

## 2020-06-20 DIAGNOSIS — I471 Supraventricular tachycardia: Secondary | ICD-10-CM | POA: Diagnosis not present

## 2020-06-20 DIAGNOSIS — I129 Hypertensive chronic kidney disease with stage 1 through stage 4 chronic kidney disease, or unspecified chronic kidney disease: Secondary | ICD-10-CM | POA: Diagnosis not present

## 2020-06-20 HISTORY — PX: SVT ABLATION: EP1225

## 2020-06-20 SURGERY — SVT ABLATION
Anesthesia: General

## 2020-06-20 MED ORDER — ISOPROTERENOL HCL 0.2 MG/ML IJ SOLN
INTRAVENOUS | Status: DC | PRN
  Administered 2020-06-20: 4 ug/min via INTRAVENOUS

## 2020-06-20 MED ORDER — ISOPROTERENOL HCL 0.2 MG/ML IJ SOLN
INTRAMUSCULAR | Status: AC
  Filled 2020-06-20: qty 5

## 2020-06-20 MED ORDER — SODIUM CHLORIDE 0.9 % IV SOLN: 250.0000 mL | INTRAVENOUS | Status: DC | PRN

## 2020-06-20 MED ORDER — ONDANSETRON HCL 4 MG/2ML IJ SOLN
INTRAMUSCULAR | Status: DC | PRN
  Administered 2020-06-20: 4 mg via INTRAVENOUS

## 2020-06-20 MED ORDER — HYDROCODONE-ACETAMINOPHEN 5-325 MG PO TABS: 1.0000 | ORAL_TABLET | ORAL | Status: DC | PRN

## 2020-06-20 MED ORDER — LIDOCAINE 2% (20 MG/ML) 5 ML SYRINGE
INTRAMUSCULAR | Status: DC | PRN
  Administered 2020-06-20: 20 mg via INTRAVENOUS

## 2020-06-20 MED ORDER — SODIUM CHLORIDE 0.9% FLUSH: 3.0000 mL | Freq: Two times a day (BID) | INTRAVENOUS | Status: DC

## 2020-06-20 MED ORDER — PROPOFOL 10 MG/ML IV BOLUS
INTRAVENOUS | Status: DC | PRN
  Administered 2020-06-20 (×2): 30 mg via INTRAVENOUS

## 2020-06-20 MED ORDER — BUPIVACAINE HCL (PF) 0.25 % IJ SOLN
INTRAMUSCULAR | Status: AC
  Filled 2020-06-20: qty 30

## 2020-06-20 MED ORDER — FENTANYL CITRATE (PF) 100 MCG/2ML IJ SOLN
INTRAMUSCULAR | Status: DC | PRN
  Administered 2020-06-20 (×2): 25 ug via INTRAVENOUS

## 2020-06-20 MED ORDER — MIDAZOLAM HCL 5 MG/5ML IJ SOLN
INTRAMUSCULAR | Status: DC | PRN
  Administered 2020-06-20: 2 mg via INTRAVENOUS

## 2020-06-20 MED ORDER — SODIUM CHLORIDE 0.9 % IV SOLN: INTRAVENOUS | Status: DC

## 2020-06-20 MED ORDER — ACETAMINOPHEN 325 MG PO TABS: 650.0000 mg | ORAL_TABLET | ORAL | Status: DC | PRN

## 2020-06-20 MED ORDER — PROPOFOL 500 MG/50ML IV EMUL
INTRAVENOUS | Status: DC | PRN
  Administered 2020-06-20: 50 ug/kg/min via INTRAVENOUS
  Administered 2020-06-20: 75 ug/kg/min via INTRAVENOUS

## 2020-06-20 MED ORDER — ONDANSETRON HCL 4 MG/2ML IJ SOLN: 4.0000 mg | Freq: Four times a day (QID) | INTRAMUSCULAR | Status: DC | PRN

## 2020-06-20 MED ORDER — BUPIVACAINE HCL (PF) 0.25 % IJ SOLN
INTRAMUSCULAR | Status: DC | PRN
  Administered 2020-06-20: 20 mL

## 2020-06-20 MED ORDER — SODIUM CHLORIDE 0.9% FLUSH: 3.0000 mL | INTRAVENOUS | Status: DC | PRN

## 2020-06-20 SURGICAL SUPPLY — 15 items
CATH JOSEPH QUAD ALLRED 6F REP (CATHETERS) ×4 IMPLANT
CATH WEB BI DIR CSDF CRV REPRO (CATHETERS) ×1 IMPLANT
DEVICE CLOSURE PERCLS PRGLD 6F (VASCULAR PRODUCTS) IMPLANT
INTRODUCER SWARTZ SRO 8F (SHEATH) ×2 IMPLANT
MAT PREVALON FULL STRYKER (MISCELLANEOUS) ×2 IMPLANT
PACK EP LATEX FREE (CUSTOM PROCEDURE TRAY) ×2
PACK EP LF (CUSTOM PROCEDURE TRAY) ×1 IMPLANT
PAD PRO RADIOLUCENT 2001M-C (PAD) ×2 IMPLANT
PATCH CARTO3 (PAD) ×1 IMPLANT
PERCLOSE PROGLIDE 6F (VASCULAR PRODUCTS) ×6
SHEATH PINNACLE 6F 10CM (SHEATH) ×2 IMPLANT
SHEATH PINNACLE 7F 10CM (SHEATH) ×1 IMPLANT
SHEATH PINNACLE 8F 10CM (SHEATH) ×1 IMPLANT
SHEATH PROBE COVER 6X72 (BAG) ×2 IMPLANT
SHIELD RADPAD SCOOP 12X17 (MISCELLANEOUS) ×2 IMPLANT

## 2020-06-20 NOTE — Anesthesia Preprocedure Evaluation (Addendum)
Anesthesia Evaluation  Patient identified by MRN, date of birth, ID band Patient awake    Reviewed: Allergy & Precautions, H&P , NPO status , Patient's Chart, lab work & pertinent test results  Airway Mallampati: II  TM Distance: >3 FB Neck ROM: full    Dental  (+) Teeth Intact, Dental Advisory Given   Pulmonary neg pulmonary ROS,    breath sounds clear to auscultation       Cardiovascular hypertension, + dysrhythmias Supra Ventricular Tachycardia  Rhythm:regular Rate:Normal     Neuro/Psych negative neurological ROS  negative psych ROS   GI/Hepatic negative GI ROS, Neg liver ROS,   Endo/Other    Renal/GU Renal InsufficiencyRenal disease     Musculoskeletal   Abdominal   Peds  Hematology negative hematology ROS (+)   Anesthesia Other Findings   Reproductive/Obstetrics                            Anesthesia Physical Anesthesia Plan  ASA: II  Anesthesia Plan: MAC   Post-op Pain Management:    Induction: Intravenous  PONV Risk Score and Plan: 2 and Ondansetron, Dexamethasone, Midazolam and Treatment may vary due to age or medical condition  Airway Management Planned: Natural Airway and Simple Face Mask  Additional Equipment:   Intra-op Plan:   Post-operative Plan:   Informed Consent: I have reviewed the patients History and Physical, chart, labs and discussed the procedure including the risks, benefits and alternatives for the proposed anesthesia with the patient or authorized representative who has indicated his/her understanding and acceptance.       Plan Discussed with: CRNA, Anesthesiologist and Surgeon  Anesthesia Plan Comments:       Anesthesia Quick Evaluation

## 2020-06-20 NOTE — H&P (Signed)
CC: SVT   History of Present Illness: Spencer Thomas is a 55 y.o. male who presents today for electrophysiology study and ablation for SVT.   He was admitted with chest pain 1/22.  In retrospect, he thinks that he had SVT as the cause.  He did have documented SVT per reports during his cardiac CT (though I do not have strips to review).  He reports abrupt onset/ offset tachypalpitations for about 5 years.  He finds that exercise and bending/ twisting can be triggers.  He has recently found that vagal maneuvers are effective in terminated events. He has been prescribed toprol which has not been effective.  He has had fatigue with this medicines.  He has had nocturnal pauses also.  Today, he denies symptoms of palpitations, chest pain, shortness of breath, orthopnea, PND, lower extremity edema, claudication, dizziness, presyncope, syncope, bleeding, or neurologic sequela. The patient is tolerating medications without difficulties and is otherwise without complaint today.        Past Medical History:  Diagnosis Date  . Bradycardia    nocturnal  . CKD (chronic kidney disease), stage II   . HTN (hypertension)   . Hyperlipemia   . PSVT (paroxysmal supraventricular tachycardia) (HCC)         Past Surgical History:  Procedure Laterality Date  . APPENDECTOMY    . CORONARY ANGIOGRAPHY N/A 03/04/2020   Procedure: CORONARY ANGIOGRAPHY (CATH LAB);  Surgeon: Runell Gess, MD;  Location: [facility] INVASIVE CV LAB;  Service: Cardiovascular;  Laterality: N/A;           Current Outpatient Medications  Medication Sig Dispense Refill  . atorvastatin (LIPITOR) 40 MG tablet Take 1 tablet (40 mg total) by mouth daily. 90 tablet 3  . Magnesium 100 MG TABS Take 100 mg by mouth daily.    . Multiple Vitamin (MULTI VITAMIN DAILY PO) Take 1 tablet by mouth daily.    . pantoprazole (PROTONIX) 40 MG tablet Take 40 mg by mouth as needed.     No current facility-administered medications for  this visit.    Allergies:   Patient has no known allergies.   Social History:  The patient  reports that he has never smoked. He has never used smokeless tobacco. He reports previous alcohol use. He reports that he does not use drugs.   Family History:  The patient's family history includes Diabetes in his brother and mother.    ROS:  Please see the history of present illness.   All other systems are personally reviewed and negative.    PHYSICAL EXAM: Vitals:   06/20/20 0529  BP: 115/87  Pulse: 70  Temp: 98.3 F (36.8 C)  SpO2: 98%    GEN: Well nourished, well developed, in no acute distress HEENT: normal Neck: no JVD, carotid bruits, or masses Cardiac: RRR; no murmurs, rubs, or gallops,no edema  Respiratory:  clear to auscultation bilaterally, normal work of breathing GI: soft, nontender, nondistended, + BS MS: no deformity or atrophy Skin: warm and dry  Neuro:  Strength and sensation are intact Psych: euthymic mood, full affect    ASSESSMENT AND PLAN:  1.  SVT  Risk, benefits, and alternatives to EP study and radiofrequency ablation were again discussed in detail today. These risks include but are not limited to stroke, bleeding, vascular damage, tamponade, perforation, damage to the heart and other structures, AV block requiring pacemaker, worsening renal function, and death. The patient understands these risk and wishes to proceed.     Fayrene Fearing  Fayola Meckes MD, [facility] 06/20/2020 8:07 AM

## 2020-06-20 NOTE — Anesthesia Procedure Notes (Signed)
Procedure Name: MAC Date/Time: 06/20/2020 7:50 AM Performed by: Jenne Campus, CRNA Pre-anesthesia Checklist: Patient identified, Emergency Drugs available, Suction available and Patient being monitored Oxygen Delivery Method: Simple face mask

## 2020-06-20 NOTE — Discharge Instructions (Signed)
Post procedure care instructions No driving for 4 days. No lifting over 5 lbs for 1 week. No vigorous or sexual activity for 1 week. You may return to work/your usual activities on 06/28/20. Keep procedure site clean & dry. If you notice increased pain, swelling, bleeding or pus, call/return!  You may shower after 24 hours, but no soaking in baths/hot tubs/pools for 1 week.     Cardiac Ablation, Care After  This sheet gives you information about how to care for yourself after your procedure. Your health care provider may also give you more specific instructions. If you have problems or questions, contact your health care provider. What can I expect after the procedure? After the procedure, it is common to have:  Bruising around your puncture site.  Tenderness around your puncture site.  Skipped heartbeats.  Tiredness (fatigue).  Follow these instructions at home: Puncture site care   Follow instructions from your health care provider about how to take care of your puncture site. Make sure you: ? If present, leave stitches (sutures), skin glue, or adhesive strips in place. These skin closures may need to stay in place for up to 2 weeks. If adhesive strip edges start to loosen and curl up, you may trim the loose edges. Do not remove adhesive strips completely unless your health care provider tells you to do that. ? If a square bandage is present, this may be removed in 24 hours.   Check your puncture site every day for signs of infection. Check for: ? Redness, swelling, or pain. ? Fluid or blood. If your puncture site starts to bleed, lie down on your back, apply firm pressure to the area, and contact your health care provider. ? Warmth. ? Pus or a bad smell. Driving  Do not drive for at least 4 days after your procedure or however long your health care provider recommends. (Do not resume driving if you have previously been instructed not to drive for other health reasons.)  Do not  drive or use heavy machinery while taking prescription pain medicine. Activity  Avoid activities that take a lot of effort for at least 7 days after your procedure.  Do not lift anything that is heavier than 5 lb (4.5 kg) for one week.   No sexual activity for 1 week.   Return to your normal activities as told by your health care provider. Ask your health care provider what activities are safe for you. General instructions  Take over-the-counter and prescription medicines only as told by your health care provider.  Do not use any products that contain nicotine or tobacco, such as cigarettes and e-cigarettes. If you need help quitting, ask your health care provider.  You may shower after 24 hours, but Do not take baths, swim, or use a hot tub for 1 week.   Do not drink alcohol for 24 hours after your procedure.  Keep all follow-up visits as told by your health care provider. This is important. Contact a health care provider if:  You have redness, mild swelling, or pain around your puncture site.  You have fluid or blood coming from your puncture site that stops after applying firm pressure to the area.  Your puncture site feels warm to the touch.  You have pus or a bad smell coming from your puncture site.  You have a fever.  You have chest pain or discomfort that spreads to your neck, jaw, or arm.  You are sweating a lot.  You feel  nauseous.  You have a fast or irregular heartbeat.  You have shortness of breath.  You are dizzy or light-headed and feel the need to lie down.  You have pain or numbness in the arm or leg closest to your puncture site. Get help right away if:  Your puncture site suddenly swells.  Your puncture site is bleeding and the bleeding does not stop after applying firm pressure to the area. These symptoms may represent a serious problem that is an emergency. Do not wait to see if the symptoms will go away. Get medical help right away. Call your  local emergency services (911 in the [place] Do not drive yourself to the hospital. Summary  After the procedure, it is normal to have bruising and tenderness at the puncture site in your groin, neck, or forearm.  Check your puncture site every day for signs of infection.  Get help right away if your puncture site is bleeding and the bleeding does not stop after applying firm pressure to the area. This is a medical emergency. This information is not intended to replace advice given to you by your health care provider. Make sure you discuss any questions you have with your health care provider.

## 2020-06-20 NOTE — Transfer of Care (Signed)
Immediate Anesthesia Transfer of Care Note  Patient: Lovell Sheehan  Procedure(s) Performed: SVT ABLATION (N/A )  Patient Location: Cath Lab  Anesthesia Type:MAC  Level of Consciousness: oriented, sedated and patient cooperative  Airway & Oxygen Therapy: Patient Spontanous Breathing and Patient connected to nasal cannula oxygen  Post-op Assessment: Report given to RN and Post -op Vital signs reviewed and stable  Post vital signs: Reviewed  Last Vitals:  Vitals Value Taken Time  BP    Temp    Pulse 65 06/20/20 0948  Resp 23 06/20/20 0948  SpO2 96 % 06/20/20 0948  Vitals shown include unvalidated device data.  Last Pain:  Vitals:   06/20/20 0529  TempSrc: Oral         Complications: No complications documented.

## 2020-06-21 ENCOUNTER — Encounter (HOSPITAL_COMMUNITY): Payer: Self-pay | Admitting: Internal Medicine

## 2020-06-21 NOTE — Anesthesia Postprocedure Evaluation (Signed)
Anesthesia Post Note  Patient: Spencer Thomas  Procedure(s) Performed: SVT ABLATION (N/A )     Patient location during evaluation: Cath Lab Anesthesia Type: General Level of consciousness: awake and alert Pain management: pain level controlled Vital Signs Assessment: post-procedure vital signs reviewed and stable Respiratory status: spontaneous breathing, nonlabored ventilation, respiratory function stable and patient connected to nasal cannula oxygen Cardiovascular status: stable and blood pressure returned to baseline Postop Assessment: no apparent nausea or vomiting Anesthetic complications: no   No complications documented.  Last Vitals:  Vitals:   06/20/20 1200 06/20/20 1230  BP: 121/73 131/73  Pulse: 64 67  Resp: 18 (!) 8  Temp:    SpO2: 99% 99%    Last Pain:  Vitals:   06/20/20 1025  TempSrc:   PainSc: 0-No pain                 [name] S

## 2020-06-24 ENCOUNTER — Other Ambulatory Visit: Payer: Self-pay

## 2020-06-24 ENCOUNTER — Other Ambulatory Visit: Payer: 59 | Admitting: *Deleted

## 2020-06-24 ENCOUNTER — Encounter (HOSPITAL_COMMUNITY): Payer: Self-pay | Admitting: Internal Medicine

## 2020-06-24 DIAGNOSIS — N182 Chronic kidney disease, stage 2 (mild): Secondary | ICD-10-CM | POA: Diagnosis not present

## 2020-06-24 DIAGNOSIS — I471 Supraventricular tachycardia: Secondary | ICD-10-CM

## 2020-06-24 DIAGNOSIS — E785 Hyperlipidemia, unspecified: Secondary | ICD-10-CM

## 2020-06-24 DIAGNOSIS — R001 Bradycardia, unspecified: Secondary | ICD-10-CM

## 2020-06-24 LAB — COMPREHENSIVE METABOLIC PANEL
ALT: 25 IU/L (ref 0–44)
AST: 22 IU/L (ref 0–40)
Albumin/Globulin Ratio: 1.8 (ref 1.2–2.2)
Albumin: 4.4 g/dL (ref 3.8–4.9)
Alkaline Phosphatase: 66 IU/L (ref 44–121)
BUN/Creatinine Ratio: 13 (ref 9–20)
BUN: 16 mg/dL (ref 6–24)
Bilirubin Total: 0.7 mg/dL (ref 0.0–1.2)
CO2: 24 mmol/L (ref 20–29)
Calcium: 9.6 mg/dL (ref 8.7–10.2)
Chloride: 105 mmol/L (ref 96–106)
Creatinine, Ser: 1.23 mg/dL (ref 0.76–1.27)
Globulin, Total: 2.4 g/dL (ref 1.5–4.5)
Glucose: 95 mg/dL (ref 65–99)
Potassium: 4 mmol/L (ref 3.5–5.2)
Sodium: 142 mmol/L (ref 134–144)
Total Protein: 6.8 g/dL (ref 6.0–8.5)
eGFR: 70 mL/min/{1.73_m2} (ref >59)

## 2020-06-24 LAB — LIPID PANEL
Chol/HDL Ratio: 5.9 ratio — ABNORMAL HIGH (ref 0.0–5.0)
Cholesterol, Total: 230 mg/dL — ABNORMAL HIGH (ref 100–199)
HDL: 39 mg/dL — ABNORMAL LOW (ref >39)
LDL Chol Calc (NIH): 171 mg/dL — ABNORMAL HIGH (ref 0–99)
Triglycerides: 109 mg/dL (ref 0–149)
VLDL Cholesterol Cal: 20 mg/dL (ref 5–40)

## 2020-06-25 ENCOUNTER — Telehealth: Payer: Self-pay

## 2020-06-25 DIAGNOSIS — E785 Hyperlipidemia, unspecified: Secondary | ICD-10-CM

## 2020-06-25 MED ORDER — ROSUVASTATIN CALCIUM 40 MG PO TABS: 40.0000 mg | ORAL_TABLET | Freq: Every day | ORAL | 3 refills | Status: DC

## 2020-06-25 NOTE — Telephone Encounter (Signed)
Called patient, reviewed results and Provider recommendations.  He is agreeable to plan of care and verbalizes understanding. Prescription sent to pharmacy of patient choice. Follow up lab appointment made for 08/07/20, pt knows to fast prior to appointment.

## 2020-06-25 NOTE — Telephone Encounter (Signed)
-----   Message from Laurann Montana, [place] sent at 06/25/2020  1:27 PM EDT ----- Recommend to switch atorvastatin to Crestor 40mg  daily Recheck liver/lipids fasting in 6 weeks If still elevated at that time, will refer to lipid clinic

## 2020-06-26 ENCOUNTER — Other Ambulatory Visit: Payer: Self-pay

## 2020-06-26 ENCOUNTER — Ambulatory Visit (HOSPITAL_BASED_OUTPATIENT_CLINIC_OR_DEPARTMENT_OTHER): Payer: 59 | Attending: Cardiology | Admitting: Cardiology

## 2020-06-26 DIAGNOSIS — R0902 Hypoxemia: Secondary | ICD-10-CM | POA: Insufficient documentation

## 2020-06-26 DIAGNOSIS — G4736 Sleep related hypoventilation in conditions classified elsewhere: Secondary | ICD-10-CM | POA: Insufficient documentation

## 2020-06-26 DIAGNOSIS — G4734 Idiopathic sleep related nonobstructive alveolar hypoventilation: Secondary | ICD-10-CM | POA: Diagnosis not present

## 2020-06-26 DIAGNOSIS — I455 Other specified heart block: Secondary | ICD-10-CM

## 2020-06-26 DIAGNOSIS — R0683 Snoring: Secondary | ICD-10-CM | POA: Diagnosis present

## 2020-06-26 DIAGNOSIS — G4733 Obstructive sleep apnea (adult) (pediatric): Secondary | ICD-10-CM | POA: Insufficient documentation

## 2020-06-27 ENCOUNTER — Other Ambulatory Visit (HOSPITAL_BASED_OUTPATIENT_CLINIC_OR_DEPARTMENT_OTHER): Payer: Self-pay

## 2020-06-27 DIAGNOSIS — R0683 Snoring: Secondary | ICD-10-CM

## 2020-06-27 DIAGNOSIS — I455 Other specified heart block: Secondary | ICD-10-CM

## 2020-07-10 NOTE — Procedures (Signed)
   Patient Name: Spencer Thomas, Spencer Thomas Date: 06/26/2020 Gender: Male D.O.B: 1965/05/02 Age (years): 55 Referring Provider: Lars Masson Height (inches): 71 Interpreting Physician: Armanda Magic MD, ABSM Weight (lbs): 265 RPSGT: Shelah Lewandowsky BMI: 37 MRN: [id number] Neck Size: 15.50  CLINICAL INFORMATION Sleep Study Type: NPSG  Indication for sleep study: Daytime Fatigue, Fatigue, Obesity, Witnesses Apnea / Gasping During Sleep  Epworth Sleepiness Score: 17  SLEEP STUDY TECHNIQUE As per the AASM Manual for the Scoring of Sleep and Associated Events v2.3 (April 2016) with a hypopnea requiring 4% desaturations.  The channels recorded and monitored were frontal, central and occipital EEG, electrooculogram (EOG), submentalis EMG (chin), nasal and oral airflow, thoracic and abdominal wall motion, anterior tibialis EMG, snore microphone, electrocardiogram, and pulse oximetry.  MEDICATIONS Medications self-administered by patient taken the night of the study : N/A  SLEEP ARCHITECTURE The study was initiated at 9:54:42 PM and ended at 4:58:11 AM.  Sleep onset time was 4.4 minutes and the sleep efficiency was 85.2%. The total sleep time was 361 minutes.  Stage REM latency was 145.5 minutes.  The patient spent 15.7% of the night in stage N1 sleep, 72.2% in stage N2 sleep, 0.0% in stage N3 and 12.2% in REM.  Alpha intrusion was absent.  Supine sleep was 88.50%.  RESPIRATORY PARAMETERS The overall apnea/hypopnea index (AHI) was 41.2 per hour. There were 0 total apneas, including 0 obstructive, 0 central and 0 mixed apneas. There were 248 hypopneas and 45 RERAs.  The AHI during Stage REM sleep was 69.5 per hour.  AHI while supine was 43.6 per hour.  The mean oxygen saturation was 94.5%. The minimum SpO2 during sleep was 73.0%.  moderate snoring was noted during this study.  CARDIAC DATA The 2 lead EKG demonstrated sinus rhythm. The mean heart rate was 60.4 beats per minute.  Other EKG findings include: None.  LEG MOVEMENT DATA The total PLMS were 0 with a resulting PLMS index of 0.0. Associated arousal with leg movement index was 0.0 .  IMPRESSIONS - Severe obstructive sleep apnea occurred during this study (AHI = 41.2/h). - Moderate oxygen desaturation was noted during this study (Min O2 = 73.0%). - The patient snored with moderate snoring volume. - No cardiac abnormalities were noted during this study. - Clinically significant periodic limb movements did not occur during sleep. No significant associated arousals.  DIAGNOSIS - Obstructive Sleep Apnea (G47.33) - Nocturnal Hypoxemia (G47.36)  RECOMMENDATIONS - Therapeutic CPAP titration to determine optimal pressure required to alleviate sleep disordered breathing. - Positional therapy avoiding supine position during sleep. - Avoid alcohol, sedatives and other CNS depressants that may worsen sleep apnea and disrupt normal sleep architecture. - Sleep hygiene should be reviewed to assess factors that may improve sleep quality. - Weight management and regular exercise should be initiated or continued if appropriate.  [Electronically signed] 07/10/2020 05:43 PM  Armanda Magic MD, ABSM Diplomate, American Board of Sleep Medicine

## 2020-07-16 ENCOUNTER — Telehealth: Payer: Self-pay | Admitting: *Deleted

## 2020-07-16 DIAGNOSIS — G4733 Obstructive sleep apnea (adult) (pediatric): Secondary | ICD-10-CM

## 2020-07-16 NOTE — Telephone Encounter (Signed)
The patient has been notified of the result and verbalized understanding.  All questions (if any) were answered. Latrelle Dodrill, CMA 07/16/2020 12:02 PM   Informed patient of sleep study results and patient understanding was verbalized.  Titration sent to sleep pool.

## 2020-07-16 NOTE — Telephone Encounter (Signed)
-----   Message from Quintella Reichert, MD sent at 07/10/2020  5:47 PM EDT ----- Please let patient know that they have sleep apnea.  Recommend therapeutic CPAP titration for treatment of patient's sleep disordered breathing.  If unable to perform an in lab titration then initiate ResMed auto CPAP from 4 to 15cm H2O with heated humidity and mask of choice and overnight pulse ox on CPAP.

## 2020-07-22 ENCOUNTER — Other Ambulatory Visit: Payer: Self-pay

## 2020-07-22 ENCOUNTER — Ambulatory Visit: Payer: 59 | Admitting: Internal Medicine

## 2020-07-22 ENCOUNTER — Encounter: Payer: Self-pay | Admitting: Internal Medicine

## 2020-07-22 VITALS — BP 120/78 | HR 78 | Ht 70.5 in | Wt 271.0 lb

## 2020-07-22 DIAGNOSIS — I471 Supraventricular tachycardia: Secondary | ICD-10-CM

## 2020-07-22 NOTE — Progress Notes (Signed)
   PCP: Maudie Flakes, FNP   Primary EP: Dr Elinor Dodge is a 55 y.o. male who presents today for routine electrophysiology followup.  Since his ablation, the patient reports doing very well.  No further SVT.  Pleased with results.  Denies procedure related complications.  Today, he denies symptoms of palpitations, chest pain, shortness of breath,  lower extremity edema, dizziness, presyncope, or syncope.  The patient is otherwise without complaint today.   Past Medical History:  Diagnosis Date   Bradycardia    nocturnal   CKD (chronic kidney disease), stage II    HTN (hypertension)    Hyperlipemia    PSVT (paroxysmal supraventricular tachycardia) (HCC)    Past Surgical History:  Procedure Laterality Date   APPENDECTOMY     CORONARY ANGIOGRAPHY N/A 03/04/2020   Procedure: CORONARY ANGIOGRAPHY (CATH LAB);  Surgeon: Runell Gess, MD;  Location: [facility] INVASIVE CV LAB;  Service: Cardiovascular;  Laterality: N/A;   SVT ABLATION N/A 06/20/2020   Procedure: SVT ABLATION;  Surgeon: Hillis Range, MD;  Location: [facility] INVASIVE CV LAB;  Service: Cardiovascular;  Laterality: N/A;    ROS- all systems are reviewed and negatives except as per HPI above  Current Outpatient Medications  Medication Sig Dispense Refill   cetirizine (ZYRTEC) 10 MG tablet Take 10 mg by mouth daily as needed for allergies.     cholecalciferol (VITAMIN D3) 25 MCG (1000 UNIT) tablet Take 1,000 Units by mouth daily.     MAGNESIUM PO Take 1 tablet by mouth daily.     Multiple Vitamin (MULTI VITAMIN DAILY PO) Take 1 tablet by mouth daily.     naproxen sodium (ALEVE) 220 MG tablet Take 440 mg by mouth daily as needed (pain).     rosuvastatin (CRESTOR) 40 MG tablet Take 1 tablet (40 mg total) by mouth daily. 90 tablet 3   No current facility-administered medications for this visit.    Physical Exam: Vitals:   07/22/20 1533  BP: 120/78  Pulse: 78  SpO2: 98%  Weight: 271 lb (122.9 kg)  Height: 5' 10.5"  (1.791 m)    GEN- The patient is well appearing, alert and oriented x 3 today.   Head- normocephalic, atraumatic Eyes-  Sclera clear, conjunctiva pink Ears- hearing intact Oropharynx- clear Lungs- Clear to ausculation bilaterally, normal work of breathing Heart- Regular rate and rhythm, no murmurs, rubs or gallops, PMI not laterally displaced GI- soft, NT, ND, + BS Extremities- no clubbing, cyanosis, or edema  Wt Readings from Last 3 Encounters:  07/22/20 271 lb (122.9 kg)  06/26/20 265 lb (120.2 kg)  06/20/20 265 lb (120.2 kg)    EKG tracing ordered today is personally reviewed and shows sinus  Assessment and Plan:  SVT Doing well s/p slow pathway ablation  Risks, benefits and potential toxicities for medications prescribed and/or refilled reviewed with patient today.   2. OSA Recently diagnosed Follow-up with Dr Mayford Knife  3. Overweight Body mass index is 38.33 kg/m. Lifestyle modification advised  Return as needed  Hillis Range MD, [facility] 07/22/2020 3:40 PM

## 2020-07-22 NOTE — Patient Instructions (Signed)
Medication Instructions:  Your physician recommends that you continue on your current medications as directed. Please refer to the Current Medication list given to you today.  Labwork: None ordered.  Testing/Procedures: None ordered.  Follow-Up: Your physician wants you to follow-up in: As needed with  James Allred, MD    Any Other Special Instructions Will Be Listed Below (If Applicable).  If you need a refill on your cardiac medications before your next appointment, please call your pharmacy.        

## 2020-07-23 ENCOUNTER — Telehealth: Payer: Self-pay

## 2020-07-23 NOTE — Telephone Encounter (Signed)
Spoke with pt regarding sleep study appt. Pt was advise that his appt is on 09/26/20 at 8pm. Pt agreed and confirmed appt.

## 2020-07-23 NOTE — Addendum Note (Signed)
Addended by: Reesa Chew on: 07/23/2020 11:17 AM   Modules accepted: Orders

## 2020-07-26 NOTE — Telephone Encounter (Addendum)
SCHEDULED  Gaynelle Cage, CMA  Reesa Chew, CMA No PA is needed.    From: Reesa Chew, CMA  Sent: 07/16/2020   1:11 PM EDT  To: Cv Div Sleep Studies  Subject: precert                                          Recommend therapeutic CPAP titration

## 2020-08-05 ENCOUNTER — Other Ambulatory Visit: Payer: Self-pay

## 2020-08-05 ENCOUNTER — Other Ambulatory Visit: Payer: 59 | Admitting: *Deleted

## 2020-08-05 DIAGNOSIS — E785 Hyperlipidemia, unspecified: Secondary | ICD-10-CM | POA: Diagnosis not present

## 2020-08-05 LAB — LIPID PANEL
Chol/HDL Ratio: 4.4 ratio (ref 0.0–5.0)
Cholesterol, Total: 175 mg/dL (ref 100–199)
HDL: 40 mg/dL (ref >39)
LDL Chol Calc (NIH): 110 mg/dL — ABNORMAL HIGH (ref 0–99)
Triglycerides: 139 mg/dL (ref 0–149)
VLDL Cholesterol Cal: 25 mg/dL (ref 5–40)

## 2020-08-05 LAB — HEPATIC FUNCTION PANEL
ALT: 48 IU/L — ABNORMAL HIGH (ref 0–44)
AST: 40 IU/L (ref 0–40)
Albumin: 4.8 g/dL (ref 3.8–4.9)
Alkaline Phosphatase: 68 IU/L (ref 44–121)
Bilirubin Total: 0.9 mg/dL (ref 0.0–1.2)
Bilirubin, Direct: 0.27 mg/dL (ref 0.00–0.40)
Total Protein: 7.3 g/dL (ref 6.0–8.5)

## 2020-08-06 ENCOUNTER — Telehealth: Payer: Self-pay | Admitting: *Deleted

## 2020-08-06 DIAGNOSIS — E785 Hyperlipidemia, unspecified: Secondary | ICD-10-CM

## 2020-08-06 NOTE — Telephone Encounter (Signed)
-----   Message from Laurann Montana, [place] sent at 08/06/2020 12:53 PM EDT ----- Please let patient know cholesterol much improved but LDL is still slightly elevated. Still, there is substantial improvement with addition of Crestor. ALT is minimally elevated, doubt of clinical significance but recommend repeat ALT only in ~2 weeks, nonfasting. (One of our liver markers. Can sometimes represent a condition called fatty liver as well.) No prior history of CAD or atherosclerosis so at this time I would continue Crestor if he is tolerating well, would recommend to really commit to working on healthy diet/exercise as this will continue to make an impact on heart health/prevention. If having any side effects with Crestor like he did with the atorvastatin limiting his ability to continue this medicine, please refer to lipid clinic instead.

## 2020-08-07 ENCOUNTER — Other Ambulatory Visit: Payer: 59

## 2020-08-20 ENCOUNTER — Other Ambulatory Visit: Payer: Self-pay

## 2020-08-20 ENCOUNTER — Other Ambulatory Visit: Payer: 59 | Admitting: *Deleted

## 2020-08-20 DIAGNOSIS — E785 Hyperlipidemia, unspecified: Secondary | ICD-10-CM

## 2020-08-20 LAB — ALT: ALT: 38 IU/L (ref 0–44)

## 2020-08-21 NOTE — Progress Notes (Signed)
Pt has been made aware of normal result and verbalized understanding.  jw

## 2020-09-12 ENCOUNTER — Ambulatory Visit: Payer: 59

## 2020-09-13 ENCOUNTER — Other Ambulatory Visit: Payer: Self-pay

## 2020-09-13 ENCOUNTER — Encounter: Payer: Self-pay | Admitting: Pharmacist

## 2020-09-13 ENCOUNTER — Ambulatory Visit (INDEPENDENT_AMBULATORY_CARE_PROVIDER_SITE_OTHER): Payer: 59 | Admitting: Pharmacist

## 2020-09-13 DIAGNOSIS — T466X5A Adverse effect of antihyperlipidemic and antiarteriosclerotic drugs, initial encounter: Secondary | ICD-10-CM

## 2020-09-13 DIAGNOSIS — E785 Hyperlipidemia, unspecified: Secondary | ICD-10-CM

## 2020-09-13 NOTE — Patient Instructions (Addendum)
It was nice meeting you today!  We would like your LDL (bad cholesterol) to be less than 70  Due to your side effects, you can discontinue your rosuvastatin at this point  We will start a new medication called Praluent which you will inject once every 14 days  We will recheck your cholesterol in about 2-3 months  Concentrate on eating vegetables, lean proteins (chicken, fish), healthy fats, and whole grains  Try to increase your physical activity up to 30 minutes a day up to 5 days a week  Please call with any questions!  [place] Golden, PharmD, BCACP, CDCES, CPP [facility] [address], [place], [place] [address] Phone: [phone]; Fax: [phone] 09/13/2020 1:53 PM

## 2020-09-13 NOTE — Progress Notes (Signed)
Patient ID: Spencer Thomas                 DOB: 05/18/1965                    MRN: [id number]     HPI: Spencer Thomas is a 55 y.o. male patient referred to lipid clinic by Ronie Spies. PMH is significant for SVT, angina, obesity and HLD.  Patient initially failed atorvastatin and was placed on rosuvastatin.  Rosuvastatin was effective however he began developing muscle pains, nausea and dizziness.  Patient presents today in good spirits.  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  Lives alone and does most of his cooking at home.  Reports he eats a fairly heart healthy diet of salad and proteins. Does not drink sodas but will occasionally drink sweet tea. Drinks alcohol occasionally.  Had previously been physically active going to the gym about 5x a week but reports he has stopped.  Drinks mostly water now.  Does not know of any significant family history of HLD or CAD.  Reports mother and brother have DM.    Current Medications: rosuvastatin 40mg  (causing muscle pains and nausea) Intolerances: atorvastatin (ineffective) LDL goal: <70  Labs: TC 175, Trigs 139, HDL 40, LDL 110 (crestor 40mg  08/05/20)  Past Medical History:  Diagnosis Date   Bradycardia    nocturnal   CKD (chronic kidney disease), stage II    HTN (hypertension)    Hyperlipemia    PSVT (paroxysmal supraventricular tachycardia) (HCC)     Current Outpatient Medications on File Prior to Visit  Medication Sig Dispense Refill   cetirizine (ZYRTEC) 10 MG tablet Take 10 mg by mouth daily as needed for allergies.     cholecalciferol (VITAMIN D3) 25 MCG (1000 UNIT) tablet Take 1,000 Units by mouth daily.     MAGNESIUM PO Take 1 tablet by mouth daily.     Multiple Vitamin (MULTI VITAMIN DAILY PO) Take 1 tablet by mouth daily.     naproxen sodium (ALEVE) 220 MG tablet Take 440 mg by mouth daily as needed (pain).     rosuvastatin (CRESTOR) 40 MG tablet Take 1 tablet (40 mg total) by mouth  daily. 90 tablet 3   No current facility-administered medications on file prior to visit.    No Known Allergies  Assessment/Plan:  1. Hyperlipidemia - Patient LDL 110 which is above goal of <70 however having adverse effects to Crestor.  It had been effective however since his LDL reduced from 170 in the previous months.  Recommended he discontinue at this point.  Recommended he increase exercise back up to at least 30 minutes a day at least 5 days a week.  Advised to continue heart healthy diet including fruits and vegetables, whole grains, lean proteins, and healthy fats.  Will start patient on Praluent 75mg .  Using demo pen, educated patient on mechanism of action, storage, site selection, and administration.  Patient was able to demonstrate use in room.  Printed copay card for patient and will complete prior authorization.  Will recheck lipid panel in 2-3 months.  Stop crestor 40mg  Start Praluent 75mg  once every 14 days Recheck lipid panel in 2-3 months  , PharmD, BCACP, CDCES, CPP [facility] [address], [place], [place] Phone: [phone]; Fax: [phone] 09/13/2020 2:07 PM

## 2020-09-16 ENCOUNTER — Telehealth: Payer: Self-pay | Admitting: Pharmacist

## 2020-09-16 DIAGNOSIS — E785 Hyperlipidemia, unspecified: Secondary | ICD-10-CM

## 2020-09-16 NOTE — Telephone Encounter (Signed)
Praluent PA approved through 09/14/21. I have called pt and LVM for him to call back. Need to confirm pharmacy so I can send in Rx. Labs already scheduled and pt already had copay card.

## 2020-09-24 MED ORDER — PRALUENT 75 MG/ML ~~LOC~~ SOAJ: 1.0000 "pen " | SUBCUTANEOUS | 3 refills | Status: DC

## 2020-09-24 NOTE — Telephone Encounter (Signed)
Spoke with patient.  Prefers [facility].  Lipid panel scheduled for 10/19

## 2020-09-26 ENCOUNTER — Ambulatory Visit (HOSPITAL_BASED_OUTPATIENT_CLINIC_OR_DEPARTMENT_OTHER): Payer: 59 | Attending: Cardiology | Admitting: Cardiology

## 2020-09-26 ENCOUNTER — Other Ambulatory Visit: Payer: Self-pay

## 2020-09-26 DIAGNOSIS — G4733 Obstructive sleep apnea (adult) (pediatric): Secondary | ICD-10-CM | POA: Insufficient documentation

## 2020-09-29 NOTE — Procedures (Signed)
   Patient Name: Spencer Thomas, Spencer Thomas Date: 09/26/2020 Gender: Male D.O.B: 1965/03/08 Age (years): 55 Referring Provider: Lars Masson Height (inches): 71 Interpreting Physician: Armanda Magic MD, ABSM Weight (lbs): 265 RPSGT: Armen Pickup BMI: 37 MRN: [id number] Neck Size: 15.50  CLINICAL INFORMATION The patient is referred for a BiPAP titration to treat sleep apnea.  SLEEP STUDY TECHNIQUE As per the AASM Manual for the Scoring of Sleep and Associated Events v2.3 (April 2016) with a hypopnea requiring 4% desaturations.  The channels recorded and monitored were frontal, central and occipital EEG, electrooculogram (EOG), submentalis EMG (chin), nasal and oral airflow, thoracic and abdominal wall motion, anterior tibialis EMG, snore microphone, electrocardiogram, and pulse oximetry. Bilevel positive airway pressure (BPAP) was initiated at the beginning of the study and titrated to treat sleep-disordered breathing.  MEDICATIONS Medications self-administered by patient taken the night of the study : N/A  RESPIRATORY PARAMETERS Optimal IPAP Pressure (cm): N/A  AHI at Optimal Pressure (/hr) N/A Optimal EPAP Pressure (cm):N/A  Overall Minimal O2 (%):79.0  Minimal O2 at Optimal Pressure (%): 95.0  SLEEP ARCHITECTURE Start Time:9:33:28 PM  Stop Time:4:38:50 AM  Total Time (min):425.4  Total Sleep Time (min):348 Sleep Latency (min):5.3  Sleep Efficiency (%):81.8%  REM Latency (min):86.5  WASO (min):72.1 Stage N1 (%): 5.6%  Stage N2 (%): 0.8%  Stage N3 (%): 2.0%  Stage R (%):21.6 Supine (%):90.52  Arousal Index (/hr):25.2   CARDIAC DATA The 2 lead EKG demonstrated sinus rhythm. The mean heart rate was 56.2 beats per minute. Other EKG findings include: None.  LEG MOVEMENT DATA The total Periodic Limb Movements of Sleep (PLMS) were 0. The PLMS index was 0.0. A PLMS index of <15 is considered normal in adults.  IMPRESSIONS - An optimal PAP pressure could not be selected for  this patient due to ongoing respiratory events.  - Central sleep apnea was not noted during this titration (CAI = 0.2/h). - Severe oxygen desaturations were observed during this titration (min O2 = 79.0%). - No snoring was audible during this study. - No cardiac abnormalities were observed during this study. - Clinically significant periodic limb movements were not noted during this study. Arousals associated with PLMs were rare.  DIAGNOSIS - Obstructive Sleep Apnea (G47.33)  RECOMMENDATIONS - Trial of auto BIPAP with IPAP max 25cm H2O, EPAP min 5cm H2O and PS 4cm H2O with heated humidity and mask of choice. - Avoid alcohol, sedatives and other CNS depressants that may worsen sleep apnea and disrupt normal sleep architecture. - Sleep hygiene should be reviewed to assess factors that may improve sleep quality. - Weight management and regular exercise should be initiated or continued. - Return to Sleep Center for re-evaluation after 4 weeks of therapy  [Electronically signed] 09/29/2020 09:59 PM  Armanda Magic MD, ABSM Diplomate, American Board of Sleep Medicine

## 2020-10-10 ENCOUNTER — Telehealth: Payer: Self-pay | Admitting: *Deleted

## 2020-10-10 NOTE — Telephone Encounter (Signed)
-----   Message from Quintella Reichert, MD sent at 09/29/2020 10:03 PM EDT ----- Please let patient know that they had a difficult BiPAP titration so we will try an auto BiPAP but may need to go back to lab if it does not work and let DME know that orders are in EPIC.  Please set up 6 week OV with me.

## 2020-10-10 NOTE — Telephone Encounter (Signed)
The patient has been notified of the result and verbalized understanding.  All questions (if any) were answered. Spencer Thomas, CMA 10/10/2020 5:10 PM   Pt is aware and agreeable to results.  Upon patient request DME selection is [facility] Patient understands he will be contacted by [facility] to set up his cpap. Patient understands to call if [facility] does not contact him with new setup in a timely manner. Patient understands they will be called once confirmation has been received from [facility] that they have received their new machine to schedule 10 week follow up appointment.   [facility] notified of new cpap order  Please add to airview Patient was grateful for the call and thanked me

## 2020-10-28 ENCOUNTER — Other Ambulatory Visit: Payer: Self-pay

## 2020-10-28 ENCOUNTER — Ambulatory Visit: Payer: 59 | Admitting: Cardiology

## 2020-10-28 ENCOUNTER — Other Ambulatory Visit (HOSPITAL_COMMUNITY): Payer: Self-pay

## 2020-10-28 ENCOUNTER — Telehealth: Payer: Self-pay

## 2020-10-28 DIAGNOSIS — E785 Hyperlipidemia, unspecified: Secondary | ICD-10-CM

## 2020-10-28 MED ORDER — PRALUENT 75 MG/ML ~~LOC~~ SOAJ
1.0000 "pen " | SUBCUTANEOUS | 3 refills | Status: DC
  Filled 2020-10-28 (×2): qty 6, 84d supply, fill #0

## 2020-10-28 MED ORDER — PRALUENT 75 MG/ML ~~LOC~~ SOAJ
1.0000 "pen " | SUBCUTANEOUS | 3 refills | Status: DC
  Filled 2020-10-28 – 2020-11-05 (×2): qty 6, 84d supply, fill #0

## 2020-10-28 NOTE — Telephone Encounter (Signed)
SENT TO [facility] AS THE ONE IT WAS PREVIOUSLY SENT TO STATED THAT MAINTENANCE DRUGS MUST BE SENT TO [facility] AND THAT IS THE ONE WE HAD ON FILE. EVEN W/OUT COPAY CARD WAS $0 .  CALLED AND LMOMED THE PT AS WELL.   CALLED AND COPAY CARD GIVEN TO PHARMACY AS FOLLOWS FOR FUTURE REFILLS IN CASE PT DOESN'T MEET DEDUCTIBLE IN THE FUTURE. :

## 2020-10-28 NOTE — Addendum Note (Signed)
Addended by: Eather Colas on: 10/28/2020 12:59 PM   Modules accepted: Orders

## 2020-10-28 NOTE — Telephone Encounter (Signed)
Pt called in requesting a pharmacy change to [facility] so I switched it. Pt voiced understanding

## 2020-10-28 NOTE — Telephone Encounter (Signed)
-----   Message from Awilda Metro, RPH-CPP sent at 10/28/2020  9:52 AM EDT ----- Are you able to see why the pharmacy can't fill his Praluent please? Should have active PA on file ----- Message ----- From: Theresia Majors, RN Sent: 10/28/2020   9:05 AM EDT To: Cv Div Pharmd  Patient came in today and stated that he was having trouble getting his Praluent refilled. Is wanting to have it filled at [facility] but they would not fill without another approval.  Could you please assist him?  Thanks!

## 2020-10-31 ENCOUNTER — Other Ambulatory Visit (HOSPITAL_COMMUNITY): Payer: Self-pay

## 2020-11-05 ENCOUNTER — Other Ambulatory Visit (HOSPITAL_COMMUNITY): Payer: Self-pay

## 2020-11-27 ENCOUNTER — Other Ambulatory Visit: Payer: 59 | Admitting: *Deleted

## 2020-11-27 ENCOUNTER — Other Ambulatory Visit: Payer: Self-pay

## 2020-11-27 DIAGNOSIS — E785 Hyperlipidemia, unspecified: Secondary | ICD-10-CM

## 2020-11-27 LAB — LIPID PANEL
Chol/HDL Ratio: 3.8 ratio (ref 0.0–5.0)
Cholesterol, Total: 146 mg/dL (ref 100–199)
HDL: 38 mg/dL — ABNORMAL LOW (ref >39)
LDL Chol Calc (NIH): 81 mg/dL (ref 0–99)
Triglycerides: 152 mg/dL — ABNORMAL HIGH (ref 0–149)
VLDL Cholesterol Cal: 27 mg/dL (ref 5–40)

## 2020-12-02 ENCOUNTER — Telehealth: Payer: Self-pay

## 2020-12-02 ENCOUNTER — Other Ambulatory Visit (HOSPITAL_COMMUNITY): Payer: Self-pay

## 2020-12-02 MED ORDER — PRALUENT 150 MG/ML ~~LOC~~ SOAJ
1.0000 mL | SUBCUTANEOUS | 3 refills | Status: DC
  Filled 2020-12-02 – 2021-02-26 (×2): qty 6, 84d supply, fill #0
  Filled 2021-07-30: qty 6, 84d supply, fill #1
  Filled 2021-11-13: qty 6, 84d supply, fill #2

## 2020-12-02 NOTE — Telephone Encounter (Signed)
The patient has been notified of the result and verbalized understanding.  All questions (if any) were answered. Theresia Majors, RN 12/02/2020 1:29 PM

## 2020-12-02 NOTE — Telephone Encounter (Signed)
-----   Message from Cheree Ditto, [facility] sent at 11/29/2020  4:54 PM EDT ----- Praluent effective but LDL not to goal yet.  Recommend increasing to 150mg  sq q 14days

## 2020-12-10 ENCOUNTER — Other Ambulatory Visit (HOSPITAL_COMMUNITY): Payer: Self-pay

## 2020-12-26 DIAGNOSIS — G4733 Obstructive sleep apnea (adult) (pediatric): Secondary | ICD-10-CM | POA: Diagnosis not present

## 2021-01-25 DIAGNOSIS — G4733 Obstructive sleep apnea (adult) (pediatric): Secondary | ICD-10-CM | POA: Diagnosis not present

## 2021-01-29 ENCOUNTER — Encounter: Payer: Self-pay | Admitting: Cardiology

## 2021-01-31 ENCOUNTER — Encounter: Payer: Self-pay | Admitting: Pharmacist

## 2021-02-25 DIAGNOSIS — G4733 Obstructive sleep apnea (adult) (pediatric): Secondary | ICD-10-CM | POA: Diagnosis not present

## 2021-02-26 ENCOUNTER — Other Ambulatory Visit (HOSPITAL_COMMUNITY): Payer: Self-pay

## 2021-03-19 ENCOUNTER — Other Ambulatory Visit (HOSPITAL_COMMUNITY): Payer: Self-pay

## 2021-03-19 ENCOUNTER — Other Ambulatory Visit: Payer: Self-pay | Admitting: Family

## 2021-03-19 MED ORDER — FAMOTIDINE 20 MG PO TABS: 20.0000 mg | ORAL_TABLET | Freq: Two times a day (BID) | ORAL | 1 refills | Status: DC

## 2021-03-19 MED ORDER — IBUPROFEN 800 MG PO TABS: 800.0000 mg | ORAL_TABLET | Freq: Three times a day (TID) | ORAL | 3 refills | Status: DC | PRN

## 2021-03-19 MED ORDER — IBUPROFEN-FAMOTIDINE 800-26.6 MG PO TABS
1.0000 | ORAL_TABLET | Freq: Three times a day (TID) | ORAL | 2 refills | Status: DC | PRN
  Filled 2021-03-19: qty 90, 30d supply, fill #0

## 2021-03-20 DIAGNOSIS — G4733 Obstructive sleep apnea (adult) (pediatric): Secondary | ICD-10-CM | POA: Diagnosis not present

## 2021-03-25 ENCOUNTER — Ambulatory Visit (INDEPENDENT_AMBULATORY_CARE_PROVIDER_SITE_OTHER): Payer: 59 | Admitting: Cardiology

## 2021-03-25 ENCOUNTER — Encounter: Payer: Self-pay | Admitting: Cardiology

## 2021-03-25 ENCOUNTER — Other Ambulatory Visit: Payer: Self-pay

## 2021-03-25 VITALS — BP 112/68 | HR 83 | Ht 71.5 in | Wt 259.6 lb

## 2021-03-25 DIAGNOSIS — G4733 Obstructive sleep apnea (adult) (pediatric): Secondary | ICD-10-CM | POA: Diagnosis not present

## 2021-03-25 DIAGNOSIS — I10 Essential (primary) hypertension: Secondary | ICD-10-CM

## 2021-03-25 DIAGNOSIS — R001 Bradycardia, unspecified: Secondary | ICD-10-CM | POA: Diagnosis not present

## 2021-03-25 NOTE — Patient Instructions (Signed)
Medication Instructions:  Your physician recommends that you continue on your current medications as directed. Please refer to the Current Medication list given to you today.  *If you need a refill on your cardiac medications before your next appointment, please call your pharmacy*  Follow-Up: At [facility], you and your health needs are our priority.  As part of our continuing mission to provide you with exceptional heart care, we have created designated Provider Care Teams.  These Care Teams include your primary Cardiologist (physician) and Advanced Practice Providers (APPs -  Physician Assistants and Nurse Practitioners) who all work together to provide you with the care you need, when you need it.  Your next appointment:   1 year(s)  The format for your next appointment:   In Person  Provider:   Armanda Magic, MD    Other Instructions Dr. Mayford Knife has decreased your IPAP max to 20cm H2O and will get a download in 4 weeks

## 2021-03-25 NOTE — Progress Notes (Signed)
Cardiology Office Note:    Date:  03/25/2021   ID:  Spencer Thomas, DOB 10/23/1965, MRN [id number]  PCP:  Gregor Hams, FNP  Cardiologist:  Fransico Him, MD    Referring MD: Gregor Hams, FNP   Chief Complaint  Patient presents with   Sleep Apnea   Hypertension    History of Present Illness:    Spencer Thomas is a 56 y.o. male with a hx of bradycardia, CKD stage II, hypertension, hyperlipidemia and PSVT.  He was referred for sleep study by Dr. Meda Coffee due to nocturnal bradycardia and excessive daytime sleepiness and non restorative sleep.  He says that his colleagues thought he had narcolepsy because he was always sleeping.  He was found to have severe obstructive sleep apnea with an AHI of 41.2 with nocturnal hypoxemia with lowest O2 saturation 73%.  He underwent CPAP titration but due to ongoing events could not be treated with CPAP and was changed to auto BiPAP with IPAP max 25 cm H2O, EPAP min 5 cm H2O and pressure support 4 cm H2O.  He is doing well with his BiPAP device and thinks that he has gotten used to it.  He tolerates the mask and feels the pressure is adequate but occasionally he will wake up during the night and the pressure is blowing very hard.  Since going on BiPAP he feels rested in the am and has no significant daytime sleepiness.  He denies any significant mouth or nasal dryness or nasal congestion.  He does not think that he snores.     Past Medical History:  Diagnosis Date   Bradycardia    nocturnal   CKD (chronic kidney disease), stage II    HTN (hypertension)    Hyperlipemia    PSVT (paroxysmal supraventricular tachycardia) ([place])     Past Surgical History:  Procedure Laterality Date   APPENDECTOMY     CORONARY ANGIOGRAPHY N/A 03/04/2020   Procedure: CORONARY ANGIOGRAPHY (CATH LAB);  Surgeon: Lorretta Harp, MD;  Location: [place] [facility];  Service: Cardiovascular;  Laterality: N/A;   SVT ABLATION N/A 06/20/2020   Procedure: SVT ABLATION;   Surgeon: Thompson Grayer, MD;  Location: [place] [facility];  Service: Cardiovascular;  Laterality: N/A;    Current Medications: Current Meds  Medication Sig   Alirocumab (PRALUENT) 150 MG/ML SOAJ Inject 1 mL into the skin every 14 (fourteen) days.   cetirizine (ZYRTEC) 10 MG tablet Take 10 mg by mouth daily as needed for allergies.   cholecalciferol (VITAMIN D3) 25 MCG (1000 UNIT) tablet Take 1,000 Units by mouth daily.   famotidine (PEPCID) 20 MG tablet Take 1 tablet (20 mg total) by mouth 2 (two) times daily.   ibuprofen (ADVIL) 800 MG tablet Take 1 tablet (800 mg total) by mouth every 8 (eight) hours as needed.   MAGNESIUM PO Take 1 tablet by mouth daily.   Multiple Vitamin (MULTI VITAMIN DAILY PO) Take 1 tablet by mouth daily.     Allergies:   Crestor [rosuvastatin]   Social History   Socioeconomic History   Marital status: Single    Spouse name: Not on file   Number of children: Not on file   Years of education: Not on file   Highest education level: Not on file  Occupational History   Not on file  Tobacco Use   Smoking status: Never   Smokeless tobacco: Never  Vaping Use   Vaping Use: Never used  Substance and Sexual Activity  Alcohol use: Not Currently   Drug use: Never   Sexual activity: Not on file  Other Topics Concern   Not on file  Social History Narrative   Not on file   Social Determinants of Health   Financial Resource Strain: Not on file  Food Insecurity: Not on file  Transportation Needs: Not on file  Physical Activity: Not on file  Stress: Not on file  Social Connections: Not on file     Family History: The patient's family history includes Diabetes in his brother and mother. There is no history of CAD.  ROS:   Please see the history of present illness.    ROS  All other systems reviewed and negative.   EKGs/Labs/Other Studies Reviewed:    The following studies were reviewed today: PSG, PAP titration, PAP compliance download  EKG:  EKG  is not ordered today.  T  Recent Labs: 05/28/2020: Hemoglobin 15.7; Platelets 202 06/24/2020: BUN 16; Creatinine, Ser 1.23; Potassium 4.0; Sodium 142 08/20/2020: ALT 38   Recent Lipid Panel    Component Value Date/Time   CHOL 146 11/27/2020 1234   TRIG 152 (H) 11/27/2020 1234   HDL 38 (L) 11/27/2020 1234   CHOLHDL 3.8 11/27/2020 1234   CHOLHDL 6.1 03/02/2020 0248   VLDL 13 03/02/2020 0248   LDLCALC 81 11/27/2020 1234    Physical Exam:    VS:  BP 112/68    Pulse 83    Ht 5' 11.5" (1.816 m)    Wt 259 lb 9.6 oz (117.8 kg)    SpO2 96%    BMI 35.70 kg/m     Wt Readings from Last 3 Encounters:  03/25/21 259 lb 9.6 oz (117.8 kg)  09/26/20 265 lb (120.2 kg)  07/22/20 271 lb (122.9 kg)     GEN:  Well nourished, well developed in no acute distress HEENT: Normal NECK: No JVD; No carotid bruits LYMPHATICS: No lymphadenopathy CARDIAC: RRR, no murmurs, rubs, gallops RESPIRATORY:  Clear to auscultation without rales, wheezing or rhonchi  ABDOMEN: Soft, non-tender, non-distended MUSCULOSKELETAL:  No edema; No deformity  SKIN: Warm and dry NEUROLOGIC:  Alert and oriented x 3 PSYCHIATRIC:  Normal affect   ASSESSMENT:    1. OSA (obstructive sleep apnea)   2. Primary hypertension   3. Bradycardia    PLAN:    In order of problems listed above:  OSA - The patient is tolerating PAP therapy well without any problems. The PAP download performed by his DME was personally reviewed and interpreted by me today and showed an AHI of 1.9 /hr on auto BiPAP  with 90% compliance in using more than 4 hours nightly.  The patient has been using and benefiting from PAP use and will continue to benefit from therapy.  -since he is waking up some with high air pressure blowing in his face, I will decrease IPAP max to 20cm H2O and get a download in 4 weeks  HTN -BP is adequately controlled on exam today. -He has not require antihypertensive therapy  Bradycardia -noted during the night and likely  related to OSA -HR today is 83bpm.    Time Spent: 20 minutes total time of encounter, including 15 minutes spent in face-to-face patient care on the date of this encounter. This time includes coordination of care and counseling regarding above mentioned problem list. Remainder of non-face-to-face time involved reviewing chart documents/testing relevant to the patient encounter and documentation in the medical record. I have independently reviewed documentation from referring provider  Medication Adjustments/Labs and Tests Ordered: Current medicines are reviewed at length with the patient today.  Concerns regarding medicines are outlined above.  No orders of the defined types were placed in this encounter.  No orders of the defined types were placed in this encounter.   Signed, Fransico Him, MD  03/25/2021 9:11 AM    [place]

## 2021-03-26 ENCOUNTER — Telehealth: Payer: Self-pay | Admitting: *Deleted

## 2021-03-26 DIAGNOSIS — G4733 Obstructive sleep apnea (adult) (pediatric): Secondary | ICD-10-CM

## 2021-03-26 NOTE — Telephone Encounter (Signed)
Order placed to [facility] via community message 

## 2021-03-26 NOTE — Telephone Encounter (Signed)
-----   Message from Theresia Majors, RN sent at 03/25/2021  9:21 AM EST ----- Per Dr. Mayford Knife: decrease IPAP max to 20cm H2O and get a download in 4 weeks Thanks!

## 2021-03-28 DIAGNOSIS — G4733 Obstructive sleep apnea (adult) (pediatric): Secondary | ICD-10-CM | POA: Diagnosis not present

## 2021-04-01 ENCOUNTER — Other Ambulatory Visit: Payer: Self-pay

## 2021-04-01 ENCOUNTER — Ambulatory Visit
Admission: EM | Admit: 2021-04-01 | Discharge: 2021-04-01 | Disposition: A | Discharge status: Home / Self Care | Payer: 59 | Attending: Emergency Medicine | Admitting: Emergency Medicine

## 2021-04-01 DIAGNOSIS — J309 Allergic rhinitis, unspecified: Secondary | ICD-10-CM | POA: Diagnosis not present

## 2021-04-01 MED ORDER — IPRATROPIUM BROMIDE 0.06 % NA SOLN: 2.0000 | Freq: Four times a day (QID) | NASAL | 0 refills | Status: DC

## 2021-04-01 MED ORDER — FLUTICASONE PROPIONATE 50 MCG/ACT NA SUSP: 2.0000 | Freq: Every day | NASAL | 0 refills | Status: DC

## 2021-04-01 MED ORDER — CETIRIZINE HCL 10 MG PO TABS: 10.0000 mg | ORAL_TABLET | Freq: Every day | ORAL | 2 refills | Status: AC

## 2021-04-01 NOTE — Discharge Instructions (Addendum)
Your symptoms and my physical exam findings are concerning for exacerbation of your underlying allergies.  It is important that you are consistent with taking allergy medications exactly as prescribed.  Not doing so increases your risk of more frequent upper respiratory infections that may or may not require the use of antibiotics, serious exacerbations that require the use of oral steroids, loss of time at work and missed social opportunities.   Please see the list below for recommended medications, dosages and frequencies to provide relief of your current symptoms:    Cetirizine (Zyrtec): This is an excellent second-generation antihistamine that helps to reduce respiratory inflammatory response to viruses and environmental allergens.  Please take 1 tablet daily at bedtime.  I would continue this medication all the way through the spring allergy season.   Ipratropium (Atrovent): This is an excellent nasal decongestant spray that does not cause rebound congestion, can be used up to 4 times daily as needed, instill 2 sprays into each nare with each use.  I have provided you with a prescription for this medication.      Fluticasone (Flonase): This is a steroid nasal spray that you use once daily, 1 spray in each nare.  I recommend that you begin using this 2 to 3 weeks after you begin using Atrovent nasal spray and your symptoms are well controlled.  You are welcome to continue using Atrovent with the Flonase but is my impression that you will likely only need to use Flonase in the future.  You can reserve the Atrovent for future episodes of uncontrolled allergies.  This medication can be purchased over-the-counter however I have provided you with a prescription.       Please follow-up within the next 3 to 5 days either with your primary care provider or urgent care if your symptoms do not resolve.  If you do not have a primary care provider, we will assist you in finding one.

## 2021-04-01 NOTE — ED Triage Notes (Signed)
Pt c/o sore throat, fever and congestion for about 2 days.

## 2021-04-01 NOTE — ED Provider Notes (Signed)
[facility]    CSN: [id number] Arrival date & time: 04/01/21  1412    HISTORY   Chief Complaint  Patient presents with   Sore Throat   Nasal Congestion   HPI Spencer Thomas is a 56 y.o. male. Pt c/o sore throat, subjective fever (patient states he felt hot) and congestion for about 2 days.  Patient states that he also has a dry, irritative cough that is worse at night.  Patient reports a history of seasonal allergies in the spring.  Patient not currently taking allergy medicine at this time.  The history is provided by the patient.  Past Medical History:  Diagnosis Date   Bradycardia    nocturnal   CKD (chronic kidney disease), stage II    HTN (hypertension)    Hyperlipemia    PSVT (paroxysmal supraventricular tachycardia) [facility])    Patient Active Problem List   Diagnosis Date Noted   Adverse reaction to statin medication 09/13/2020   Sinus pause 06/26/2020   Snoring 06/26/2020   Angina at rest [facility])    SVT (supraventricular tachycardia) ([place])    Chest pain 03/01/2020   Hyperlipidemia 12/20/2019   Cardiac arrhythmia 12/20/2019   Palpitations 12/20/2019   Irregular heart rate 11/15/2019   Severe obesity (BMI 35.0-39.9) with comorbidity ([place]) 10/06/2018   Past Surgical History:  Procedure Laterality Date   APPENDECTOMY     CORONARY ANGIOGRAPHY N/A 03/04/2020   Procedure: CORONARY ANGIOGRAPHY (CATH LAB);  Surgeon: Lorretta Harp, MD;  Location: [place] [facility];  Service: Cardiovascular;  Laterality: N/A;   SVT ABLATION N/A 06/20/2020   Procedure: SVT ABLATION;  Surgeon: Thompson Grayer, MD;  Location: [place] [facility];  Service: Cardiovascular;  Laterality: N/A;    Home Medications    Prior to Admission medications   Medication Sig Start Date End Date Taking? Authorizing Provider  Alirocumab (PRALUENT) 150 MG/ML SOAJ Inject 1 mL into the skin every 14 (fourteen) days. 12/02/20   Sueanne Margarita, MD  cetirizine (ZYRTEC) 10 MG tablet Take 10 mg by mouth  daily as needed for allergies.    [provider]  cholecalciferol (VITAMIN D3) 25 MCG (1000 UNIT) tablet Take 1,000 Units by mouth daily.    [provider]  famotidine (PEPCID) 20 MG tablet Take 1 tablet (20 mg total) by mouth 2 (two) times daily. 03/19/21 05/18/21  Suzan Slick, NP  ibuprofen (ADVIL) 800 MG tablet Take 1 tablet (800 mg total) by mouth every 8 (eight) hours as needed. 03/19/21   Suzan Slick, NP  Ibuprofen-Famotidine 800-26.6 MG TABS Take 1 tablet by mouth 3 (three) times daily as needed. Patient not taking: Reported on 03/25/2021 03/19/21   Suzan Slick, NP  MAGNESIUM PO Take 1 tablet by mouth daily.    [provider]  Multiple Vitamin (MULTI VITAMIN DAILY PO) Take 1 tablet by mouth daily.    [provider]   Family History Family History  Problem Relation Age of Onset   Diabetes Mother    Diabetes Brother    CAD Neg Hx        neg for premature CAD   Social History Social History   Tobacco Use   Smoking status: Never   Smokeless tobacco: Never  Vaping Use   Vaping Use: Never used  Substance Use Topics   Alcohol use: Not Currently   Drug use: Never   Allergies   Crestor [rosuvastatin]  Review of Systems Review of Systems Pertinent findings  noted in history of present illness.   Physical Exam Triage Vital Signs ED Triage Vitals  Enc Vitals Group     BP 12/06/20 0827 (!) 147/82     Pulse Rate 12/06/20 0827 72     Resp 12/06/20 0827 18     Temp 12/06/20 0827 98.3 F (36.8 C)     Temp Source 12/06/20 0827 Oral     SpO2 12/06/20 0827 98 %     Weight --      Height --      Head Circumference --      Peak Flow --      Pain Score 12/06/20 0826 5     Pain Loc --      Pain Edu? --      Excl. in [place]? --   No data found.  Updated Vital Signs BP (!) 143/85 (BP Location: Right Arm)    Pulse 72    Temp 98.2 F (36.8 C) (Oral)    Resp 18    SpO2 98%   Physical Exam Vitals and nursing note reviewed.  Constitutional:       General: He is not in acute distress.    Appearance: Normal appearance. He is not ill-appearing.  HENT:     Head: Normocephalic and atraumatic.     Salivary Glands: Right salivary gland is not diffusely enlarged or tender. Left salivary gland is not diffusely enlarged or tender.     Right Ear: Ear canal and external ear normal. No drainage. A middle ear effusion is present. There is no impacted cerumen. Tympanic membrane is bulging. Tympanic membrane is not injected or erythematous.     Left Ear: Ear canal and external ear normal. No drainage. A middle ear effusion is present. There is no impacted cerumen. Tympanic membrane is bulging. Tympanic membrane is not injected or erythematous.     Ears:     Comments: Bilateral EACs normal, both TMs bulging with clear fluid    Nose: Rhinorrhea present. No nasal deformity, septal deviation, signs of injury, nasal tenderness, mucosal edema or congestion. Rhinorrhea is clear.     Right Nostril: Occlusion present. No foreign body, epistaxis or septal hematoma.     Left Nostril: Occlusion present. No foreign body, epistaxis or septal hematoma.     Right Turbinates: Enlarged, swollen and pale.     Left Turbinates: Enlarged, swollen and pale.     Right Sinus: No maxillary sinus tenderness or frontal sinus tenderness.     Left Sinus: No maxillary sinus tenderness or frontal sinus tenderness.     Mouth/Throat:     Lips: Pink. No lesions.     Mouth: Mucous membranes are moist. No oral lesions.     Pharynx: Oropharynx is clear. Uvula midline. No posterior oropharyngeal erythema or uvula swelling.     Tonsils: No tonsillar exudate. 0 on the right. 0 on the left.     Comments: Postnasal drip Eyes:     General: Lids are normal.        Right eye: No discharge.        Left eye: No discharge.     Extraocular Movements: Extraocular movements intact.     Conjunctiva/sclera: Conjunctivae normal.     Right eye: Right conjunctiva is not injected.     Left eye:  Left conjunctiva is not injected.  Neck:     Trachea: Trachea and phonation normal.  Cardiovascular:     Rate and Rhythm: Normal rate and regular rhythm.  Pulses: Normal pulses.     Heart sounds: Normal heart sounds. No murmur heard.   No friction rub. No gallop.  Pulmonary:     Effort: Pulmonary effort is normal. No accessory muscle usage, prolonged expiration or respiratory distress.     Breath sounds: Normal breath sounds. No stridor, decreased air movement or transmitted upper airway sounds. No decreased breath sounds, wheezing, rhonchi or rales.  Chest:     Chest wall: No tenderness.  Musculoskeletal:        General: Normal range of motion.     Cervical back: Normal range of motion and neck supple. Normal range of motion.  Lymphadenopathy:     Cervical: No cervical adenopathy.  Skin:    General: Skin is warm and dry.     Findings: No erythema or rash.  Neurological:     General: No focal deficit present.     Mental Status: He is alert and oriented to person, place, and time.  Psychiatric:        Mood and Affect: Mood normal.        Behavior: Behavior normal.    Visual Acuity Right Eye Distance:   Left Eye Distance:   Bilateral Distance:    Right Eye Near:   Left Eye Near:    Bilateral Near:     UC Couse / Diagnostics / Procedures:    EKG  Radiology No results found.  Procedures Procedures (including critical care time)  UC Diagnoses / Final Clinical Impressions(s)   I have reviewed the triage vital signs and the nursing notes.  Pertinent labs & imaging results that were available during my care of the patient were reviewed by me and considered in my medical decision making (see chart for details).   Final diagnoses:  Allergic rhinitis, unspecified seasonality, unspecified trigger   Begin Atrovent and Zyrtec.  Continue Zyrtec 3 May, can transition from Atrovent to [facility] in a few weeks.  Follow-up with PCP.  ED Prescriptions     Medication Sig  Dispense Auth. Provider   ipratropium (ATROVENT) 0.06 % nasal spray Place 2 sprays into both nostrils 4 (four) times daily. As needed for nasal congestion, runny nose 15 mL Lynden Oxford Scales, PA-C   fluticasone (FLONASE) 50 MCG/ACT nasal spray Place 2 sprays into both nostrils daily. 18 mL Lynden Oxford Scales, PA-C   cetirizine (ZYRTEC ALLERGY) 10 MG tablet Take 1 tablet (10 mg total) by mouth at bedtime. 30 tablet Lynden Oxford Scales, PA-C      PDMP not reviewed this encounter.  Pending results:  Labs Reviewed - No data to display  Medications Ordered in UC: Medications - No data to display  Disposition Upon Discharge:  Condition: stable for discharge home Home: take medications as prescribed; routine discharge instructions as discussed; follow up as advised.  Patient presented with an acute illness with associated systemic symptoms and significant discomfort requiring urgent management. In my opinion, this is a condition that a prudent lay person (someone who possesses an average knowledge of health and medicine) may potentially expect to result in complications if not addressed urgently such as respiratory distress, impairment of bodily function or dysfunction of bodily organs.   Routine symptom specific, illness specific and/or disease specific instructions were discussed with the patient and/or caregiver at length.   As such, the patient has been evaluated and assessed, work-up was performed and treatment was provided in alignment with urgent care protocols and evidence based medicine.  Patient/parent/caregiver has been advised that the patient  may require follow up for further testing and treatment if the symptoms continue in spite of treatment, as clinically indicated and appropriate.  If the patient was tested for COVID-19, Influenza and/or RSV, then the patient/parent/guardian was advised to isolate at home pending the results of his/her diagnostic coronavirus test and  potentially longer if theyre positive. I have also advised pt that if his/her COVID-19 test returns positive, it's recommended to self-isolate for at least 10 days after symptoms first appeared AND until fever-free for 24 hours without fever reducer AND other symptoms have improved or resolved. Discussed self-isolation recommendations as well as instructions for household member/close contacts as per the [facility] and [place] DHHS, and also gave patient the [facility] packet with this information.  Patient/parent/caregiver has been advised to return to the [facility] or PCP in 3-5 days if no better; to PCP or the Emergency Department if new signs and symptoms develop, or if the current signs or symptoms continue to change or worsen for further workup, evaluation and treatment as clinically indicated and appropriate  The patient will follow up with their current PCP if and as advised. If the patient does not currently have a PCP we will assist them in obtaining one.   The patient may need specialty follow up if the symptoms continue, in spite of conservative treatment and management, for further workup, evaluation, consultation and treatment as clinically indicated and appropriate.  Patient/parent/caregiver verbalized understanding and agreement of plan as discussed.  All questions were addressed during visit.  Please see discharge instructions below for further details of plan.  Discharge Instructions:   Discharge Instructions      Your symptoms and my physical exam findings are concerning for exacerbation of your underlying allergies.  It is important that you are consistent with taking allergy medications exactly as prescribed.  Not doing so increases your risk of more frequent upper respiratory infections that may or may not require the use of antibiotics, serious exacerbations that require the use of oral steroids, loss of time at work and missed social opportunities.   Please see the list below for recommended  medications, dosages and frequencies to provide relief of your current symptoms:    Cetirizine (Zyrtec): This is an excellent second-generation antihistamine that helps to reduce respiratory inflammatory response to viruses and environmental allergens.  Please take 1 tablet daily at bedtime.  I would continue this medication all the way through the spring allergy season.   Ipratropium (Atrovent): This is an excellent nasal decongestant spray that does not cause rebound congestion, can be used up to 4 times daily as needed, instill 2 sprays into each nare with each use.  I have provided you with a prescription for this medication.      Fluticasone (Flonase): This is a steroid nasal spray that you use once daily, 1 spray in each nare.  I recommend that you begin using this 2 to 3 weeks after you begin using Atrovent nasal spray and your symptoms are well controlled.  You are welcome to continue using Atrovent with the Flonase but is my impression that you will likely only need to use Flonase in the future.  You can reserve the Atrovent for future episodes of uncontrolled allergies.  This medication can be purchased over-the-counter however I have provided you with a prescription.       Please follow-up within the next 3 to 5 days either with your primary care provider or urgent care if your symptoms do not resolve.  If you  do not have a primary care provider, we will assist you in finding one.     This office note has been dictated using Museum/gallery curator.  Unfortunately, and despite my best efforts, this method of dictation can sometimes lead to occasional typographical or grammatical errors.  I apologize in advance if this occurs.     Lynden Oxford Scales, [place] 04/01/21 [phone]

## 2021-05-21 ENCOUNTER — Encounter: Payer: Self-pay | Admitting: Cardiology

## 2021-07-09 ENCOUNTER — Ambulatory Visit: Payer: Self-pay

## 2021-07-09 ENCOUNTER — Ambulatory Visit (INDEPENDENT_AMBULATORY_CARE_PROVIDER_SITE_OTHER): Payer: 59 | Admitting: Specialist

## 2021-07-09 ENCOUNTER — Other Ambulatory Visit (HOSPITAL_COMMUNITY): Payer: Self-pay

## 2021-07-09 ENCOUNTER — Encounter: Payer: Self-pay | Admitting: Specialist

## 2021-07-09 ENCOUNTER — Encounter (HOSPITAL_BASED_OUTPATIENT_CLINIC_OR_DEPARTMENT_OTHER): Payer: Self-pay

## 2021-07-09 VITALS — BP 118/83 | HR 76 | Ht 71.0 in | Wt 260.0 lb

## 2021-07-09 DIAGNOSIS — M545 Low back pain, unspecified: Secondary | ICD-10-CM | POA: Diagnosis not present

## 2021-07-09 MED ORDER — CYCLOBENZAPRINE HCL 10 MG PO TABS
10.0000 mg | ORAL_TABLET | Freq: Three times a day (TID) | ORAL | 0 refills | Status: DC | PRN
  Filled 2021-07-09: qty 30, 10d supply, fill #0

## 2021-07-09 MED ORDER — METHYLPREDNISOLONE 4 MG PO TBPK
ORAL_TABLET | ORAL | 0 refills | Status: DC
  Filled 2021-07-09: qty 21, 6d supply, fill #0

## 2021-07-09 MED ORDER — TRAMADOL HCL 50 MG PO TABS
100.0000 mg | ORAL_TABLET | Freq: Four times a day (QID) | ORAL | 0 refills | Status: DC | PRN
  Filled 2021-07-09: qty 30, 4d supply, fill #0

## 2021-07-09 NOTE — Patient Instructions (Addendum)
Avoid frequent bending and stooping  No lifting greater than 10 lbs. May use ice or moist heat for pain. Weight loss is of benefit. Best medication for lumbar disc disease is arthritis medications like motrin, celebrex and naprosyn. Exercise is important to improve your indurance and does allow people to function better inspite of back pain. Medrol dose pack 6 day Flexeril for muscular spasm Tramadol for more severe pain

## 2021-07-09 NOTE — Progress Notes (Signed)
Office Visit Note   Patient: Spencer Thomas           Date of Birth: [birth year]           MRN: [id number] Visit Date: 07/09/2021              Requested by: Gregor Hams, [place],   [id number] PCP: Gregor Hams, FNP   Assessment & Plan: Visit Diagnoses:  1. Low back pain, unspecified back pain laterality, unspecified chronicity, unspecified whether sciatica present     Plan: Avoid frequent bending and stooping  No lifting greater than 10 lbs. May use ice or moist heat for pain. Weight loss is of benefit. Best medication for lumbar disc disease is arthritis medications like motrin, celebrex and naprosyn. Exercise is important to improve your indurance and does allow people to function better inspite of back pain. Medrol dose pack 6 day Flexeril for muscular spasm Tramadol for more severe pain  Follow-Up Instructions: Return in about 1 week (around 07/16/2021).   Orders:  Orders Placed This Encounter  Procedures   XR Lumbar Spine 2-3 Views   Meds ordered this encounter  Medications   methylPREDNISolone (MEDROL DOSEPAK) 4 MG TBPK tablet    Sig: Take as directed 6 day dose pak    Dispense:  21 tablet    Refill:  0   cyclobenzaprine (FLEXERIL) 10 MG tablet    Sig: Take 1 tablet (10 mg total) by mouth 3 (three) times daily as needed for muscle spasms.    Dispense:  30 tablet    Refill:  0   traMADol (ULTRAM) 50 MG tablet    Sig: Take 2 tablets (100 mg total) by mouth every 6 (six) hours as needed.    Dispense:  30 tablet    Refill:  0      Procedures: No procedures performed   Clinical Data: No additional findings.   Subjective: Chief Complaint  Patient presents with   Lower Back - Pain    56 year old male with history of L4-5 disc protrusion 2010   Review of Systems  Constitutional: Negative.   HENT: Negative.    Eyes: Negative.   Respiratory: Negative.    Cardiovascular: Negative.   Gastrointestinal: Negative.    Endocrine: Negative.   Genitourinary: Negative.   Musculoskeletal: Negative.   Skin: Negative.   Allergic/Immunologic: Negative.   Neurological: Negative.   Hematological: Negative.   Psychiatric/Behavioral: Negative.      Objective: Vital Signs: BP 118/83 (BP Location: Left Arm, Patient Position: Sitting)   Pulse 76   Ht 5\' 11"  (1.803 m)   Wt 260 lb (117.9 kg)   BMI 36.26 kg/m   Physical Exam Constitutional:      Appearance: He is well-developed.  HENT:     Head: Normocephalic and atraumatic.  Eyes:     Pupils: Pupils are equal, round, and reactive to light.  Pulmonary:     Effort: Pulmonary effort is normal.     Breath sounds: Normal breath sounds.  Abdominal:     General: Bowel sounds are normal.     Palpations: Abdomen is soft.  Musculoskeletal:     Cervical back: Normal range of motion and neck supple.     Lumbar back: Negative right straight leg raise test and negative left straight leg raise test.  Skin:    General: Skin is warm and dry.  Neurological:     Mental Status: He is alert and  oriented to person, place, and time.  Psychiatric:        Behavior: Behavior normal.        Thought Content: Thought content normal.        Judgment: Judgment normal.    Back Exam   Tenderness  The patient is experiencing tenderness in the lumbar.  Range of Motion  Extension:  abnormal  Flexion:  abnormal  Lateral bend right:  abnormal  Lateral bend left:  abnormal  Rotation right:  abnormal  Rotation left:  abnormal   Muscle Strength  Right Quadriceps:  5/5  Left Quadriceps:  5/5  Right Hamstrings:  5/5  Left Hamstrings:  5/5   Tests  Straight leg raise right: negative Straight leg raise left: negative  Reflexes  Patellar:  2/4 Achilles:  2/4  Other  Toe walk: normal Heel walk: normal Sensation: normal Erythema: no back redness Scars: absent     Specialty Comments:  No specialty comments available.  Imaging: No results found.   PMFS  History: Patient Active Problem List   Diagnosis Date Noted   Adverse reaction to statin medication 09/13/2020   Sinus pause 06/26/2020   Snoring 06/26/2020   Angina at rest [facility])    SVT (supraventricular tachycardia) ([place])    Chest pain 03/01/2020   Hyperlipidemia 12/20/2019   Cardiac arrhythmia 12/20/2019   Palpitations 12/20/2019   Irregular heart rate 11/15/2019   Severe obesity (BMI 35.0-39.9) with comorbidity ([place]) 10/06/2018   Past Medical History:  Diagnosis Date   Bradycardia    nocturnal   CKD (chronic kidney disease), stage II    HTN (hypertension)    Hyperlipemia    PSVT (paroxysmal supraventricular tachycardia) ([place])     Family History  Problem Relation Age of Onset   Diabetes Mother    Diabetes Brother    CAD Neg Hx        neg for premature CAD    Past Surgical History:  Procedure Laterality Date   APPENDECTOMY     CORONARY ANGIOGRAPHY N/A 03/04/2020   Procedure: CORONARY ANGIOGRAPHY (CATH LAB);  Surgeon: Lorretta Harp, MD;  Location: [place] [facility];  Service: Cardiovascular;  Laterality: N/A;   SVT ABLATION N/A 06/20/2020   Procedure: SVT ABLATION;  Surgeon: Thompson Grayer, MD;  Location: [place] [facility] LAB;  Service: Cardiovascular;  Laterality: N/A;   Social History   Occupational History   Not on file  Tobacco Use   Smoking status: Never   Smokeless tobacco: Never  Vaping Use   Vaping Use: Never used  Substance and Sexual Activity   Alcohol use: Not Currently   Drug use: Never   Sexual activity: Not on file

## 2021-07-30 ENCOUNTER — Other Ambulatory Visit (HOSPITAL_COMMUNITY): Payer: Self-pay

## 2021-08-06 ENCOUNTER — Ambulatory Visit (HOSPITAL_BASED_OUTPATIENT_CLINIC_OR_DEPARTMENT_OTHER): Payer: 59 | Admitting: Family Medicine

## 2021-08-06 ENCOUNTER — Encounter (HOSPITAL_BASED_OUTPATIENT_CLINIC_OR_DEPARTMENT_OTHER): Payer: Self-pay | Admitting: Family Medicine

## 2021-08-06 DIAGNOSIS — N5089 Other specified disorders of the male genital organs: Secondary | ICD-10-CM | POA: Insufficient documentation

## 2021-08-06 DIAGNOSIS — E785 Hyperlipidemia, unspecified: Secondary | ICD-10-CM

## 2021-08-06 DIAGNOSIS — G4733 Obstructive sleep apnea (adult) (pediatric): Secondary | ICD-10-CM | POA: Insufficient documentation

## 2021-08-06 DIAGNOSIS — Z0000 Encounter for general adult medical examination without abnormal findings: Secondary | ICD-10-CM

## 2021-08-06 NOTE — Assessment & Plan Note (Signed)
Follows with cardiology regarding management of BiPAP.  Discussed importance of regular use of machine to help with controlling underlying sleep apnea and associated potential risks and complications with untreated sleep apnea

## 2021-08-06 NOTE — Progress Notes (Signed)
New Patient Office Visit  Subjective    Patient ID: Spencer Thomas, male    DOB: [birth year]  Age: 56 y.o. MRN: [id number]  CC:  Chief Complaint  Patient presents with   New Patient (Initial Visit)    Pt here to establish new care    HPI Spencer Thomas presents to establish care Last PCP - NP with [facility], last visit was about 2 years ago  Testicular mass: found with prior PCP affecting right testicle. Was scheduled to have [place] completed, but has not done so. Requesting to have this ordered for evaluation. Has not had any change in size of the area, no associated pain.  Follows with cardiology related to history of bradycardia, HTN, HLD, PSVT.  Currently has been doing well.  Did have SVT ablation in the past.  He has been checking his blood pressure at home and reports that readings tend to be around 120/70 or so.  No issues with chest pain or headaches.  OSA: Has BiPAP, was using regularly, but has not been using it as often lately, had some issues with settings. This is managed through cardiology. Report seems to indicate good control with use, but not using as often as recommended.  Patient is originally from [place], [place], has been here for about 26 years. Patient works as Pharmacologist. Outside of work, patient enjoys biking, working on old cars.  Outpatient Encounter Medications as of 08/06/2021  Medication Sig   Alirocumab (PRALUENT) 150 MG/ML SOAJ Inject 1 mL into the skin every 14 (fourteen) days.   cholecalciferol (VITAMIN D3) 25 MCG (1000 UNIT) tablet Take 1,000 Units by mouth daily.   ibuprofen (ADVIL) 800 MG tablet Take 1 tablet (800 mg total) by mouth every 8 (eight) hours as needed.   ipratropium (ATROVENT) 0.06 % nasal spray Place 2 sprays into both nostrils 4 (four) times daily. As needed for nasal congestion, runny nose   MAGNESIUM PO Take 1 tablet by mouth daily.   Multiple Vitamin (MULTI VITAMIN DAILY PO) Take 1 tablet by mouth daily.   cetirizine (ZYRTEC  ALLERGY) 10 MG tablet Take 1 tablet (10 mg total) by mouth at bedtime.   famotidine (PEPCID) 20 MG tablet Take 1 tablet (20 mg total) by mouth 2 (two) times daily.   [DISCONTINUED] cyclobenzaprine (FLEXERIL) 10 MG tablet Take 1 tablet (10 mg total) by mouth 3 (three) times daily as needed for muscle spasms.   [DISCONTINUED] fluticasone (FLONASE) 50 MCG/ACT nasal spray Place 2 sprays into both nostrils daily.   [DISCONTINUED] methylPREDNISolone (MEDROL DOSEPAK) 4 MG TBPK tablet Take as directed 6 day dose pak   [DISCONTINUED] traMADol (ULTRAM) 50 MG tablet Take 2 tablets (100 mg total) by mouth every 6 (six) hours as needed.   No facility-administered encounter medications on file as of 08/06/2021.    Past Medical History:  Diagnosis Date   Bradycardia    nocturnal   CKD (chronic kidney disease), stage II    HTN (hypertension)    Hyperlipemia    PSVT (paroxysmal supraventricular tachycardia) (HCC)     Past Surgical History:  Procedure Laterality Date   APPENDECTOMY     CORONARY ANGIOGRAPHY N/A 03/04/2020   Procedure: CORONARY ANGIOGRAPHY (CATH LAB);  Surgeon: Runell Gess, MD;  Location: [facility] INVASIVE CV LAB;  Service: Cardiovascular;  Laterality: N/A;   SVT ABLATION N/A 06/20/2020   Procedure: SVT ABLATION;  Surgeon: Hillis Range, MD;  Location: [facility] INVASIVE CV LAB;  Service: Cardiovascular;  Laterality:  N/A;    Family History  Problem Relation Age of Onset   Diabetes Mother    Diabetes Brother    CAD Neg Hx        neg for premature CAD    Social History   Socioeconomic History   Marital status: Single    Spouse name: Not on file   Number of children: Not on file   Years of education: Not on file   Highest education level: Not on file  Occupational History   Not on file  Tobacco Use   Smoking status: Never   Smokeless tobacco: Never  Vaping Use   Vaping Use: Never used  Substance and Sexual Activity   Alcohol use: Not Currently   Drug use: Never   Sexual  activity: Not on file  Other Topics Concern   Not on file  Social History Narrative   Not on file   Social Determinants of Health   Financial Resource Strain: Not on file  Food Insecurity: Not on file  Transportation Needs: Not on file  Physical Activity: Not on file  Stress: Not on file  Social Connections: Not on file  Intimate Partner Violence: Not on file    Objective    BP (!) 151/97   Pulse 84   Temp 97.8 F (36.6 C) (Oral)   Ht 5' 10.5" (1.791 m)   Wt 265 lb 3.2 oz (120.3 kg)   SpO2 99%   BMI 37.51 kg/m   Physical Exam  56 year old male in no acute distress Cardiovascular exam regular rate and rhythm, no murmur appreciated Lungs clear to auscultation bilaterally  Assessment & Plan:   Problem List Items Addressed This Visit       Respiratory   Obstructive sleep apnea    Follows with cardiology regarding management of BiPAP.  Discussed importance of regular use of machine to help with controlling underlying sleep apnea and associated potential risks and complications with untreated sleep apnea        Other   Hyperlipidemia    We will assess current status with upcoming labs related to CPE      Mass of testicle    Noted in the past, was recommended to have ultrasound completed, however has not done so as of yet.  He is still interested in proceeding with this, order placed today for ultrasound to be done at [facility] imaging.  Further recommendations pending results of imaging      Relevant Orders   US SCROTUM W/DOPPLER   Other Visit Diagnoses     Wellness examination       Relevant Orders   CBC with Differential/Platelet   Comprehensive metabolic panel   Lipid panel   TSH Rfx on Abnormal to Free T4   Hemoglobin A1c       Return in about 6 weeks (around 09/17/2021) for CPE with [facility] a few days prior.   [facility] De [place], MD

## 2021-08-06 NOTE — Assessment & Plan Note (Signed)
We will assess current status with upcoming labs related to CPE

## 2021-08-06 NOTE — Assessment & Plan Note (Signed)
Noted in the past, was recommended to have ultrasound completed, however has not done so as of yet.  He is still interested in proceeding with this, order placed today for ultrasound to be done at [facility] imaging.  Further recommendations pending results of imaging

## 2021-08-07 ENCOUNTER — Ambulatory Visit
Admission: RE | Admit: 2021-08-07 | Discharge: 2021-08-07 | Disposition: A | Discharge status: Home / Self Care | Payer: 59 | Source: Ambulatory Visit | Attending: Family Medicine | Admitting: Family Medicine

## 2021-08-07 DIAGNOSIS — N5089 Other specified disorders of the male genital organs: Secondary | ICD-10-CM

## 2021-08-07 DIAGNOSIS — I861 Scrotal varices: Secondary | ICD-10-CM | POA: Diagnosis not present

## 2021-08-07 DIAGNOSIS — N442 Benign cyst of testis: Secondary | ICD-10-CM | POA: Diagnosis not present

## 2021-09-01 ENCOUNTER — Encounter (HOSPITAL_COMMUNITY): Payer: Self-pay | Admitting: Emergency Medicine

## 2021-09-01 ENCOUNTER — Encounter: Payer: Self-pay | Admitting: Cardiology

## 2021-09-01 ENCOUNTER — Emergency Department (HOSPITAL_COMMUNITY): Payer: 59

## 2021-09-01 ENCOUNTER — Emergency Department (HOSPITAL_COMMUNITY)
Admission: EM | Admit: 2021-09-01 | Discharge: 2021-09-01 | Disposition: A | Discharge status: Home / Self Care | Payer: 59 | Attending: Emergency Medicine | Admitting: Emergency Medicine

## 2021-09-01 ENCOUNTER — Other Ambulatory Visit: Payer: Self-pay

## 2021-09-01 DIAGNOSIS — Z5321 Procedure and treatment not carried out due to patient leaving prior to being seen by health care provider: Secondary | ICD-10-CM | POA: Insufficient documentation

## 2021-09-01 DIAGNOSIS — R000 Tachycardia, unspecified: Secondary | ICD-10-CM | POA: Diagnosis not present

## 2021-09-01 DIAGNOSIS — R002 Palpitations: Secondary | ICD-10-CM | POA: Diagnosis not present

## 2021-09-01 LAB — BASIC METABOLIC PANEL
Anion gap: 9 (ref 5–15)
BUN: 14 mg/dL (ref 6–20)
CO2: 26 mmol/L (ref 22–32)
Calcium: 9.3 mg/dL (ref 8.9–10.3)
Chloride: 106 mmol/L (ref 98–111)
Creatinine, Ser: 1.54 mg/dL — ABNORMAL HIGH (ref 0.61–1.24)
GFR, Estimated: 53 mL/min — ABNORMAL LOW (ref >60)
Glucose, Bld: 106 mg/dL — ABNORMAL HIGH (ref 70–99)
Potassium: 3.8 mmol/L (ref 3.5–5.1)
Sodium: 141 mmol/L (ref 135–145)

## 2021-09-01 LAB — CBC
HCT: 49.9 % (ref 39.0–52.0)
Hemoglobin: 16.9 g/dL (ref 13.0–17.0)
MCH: 29.4 pg (ref 26.0–34.0)
MCHC: 33.9 g/dL (ref 30.0–36.0)
MCV: 86.9 fL (ref 80.0–100.0)
Platelets: 198 10*3/uL (ref 150–400)
RBC: 5.74 MIL/uL (ref 4.22–5.81)
RDW: 13.3 % (ref 11.5–15.5)
WBC: 5.5 10*3/uL (ref 4.0–10.5)
nRBC: 0 % (ref 0.0–0.2)

## 2021-09-01 LAB — TSH: TSH: 2.107 u[IU]/mL (ref 0.350–4.500)

## 2021-09-01 LAB — TROPONIN I (HIGH SENSITIVITY): Troponin I (High Sensitivity): 4 ng/L (ref <18)

## 2021-09-01 LAB — MAGNESIUM: Magnesium: 2 mg/dL (ref 1.7–2.4)

## 2021-09-01 NOTE — ED Triage Notes (Signed)
Patient today w/ tachycardia for about an hour, while seated. Samsung watch showed that HR was in the 170s. Patient also evaluated BP and was elevated. Patient did report palpitations, denies actual chest pain, denies SHOB. Aox4.

## 2021-09-01 NOTE — ED Notes (Signed)
Pt left despite being advised to stay, pt LWBS. Dragging OTF.

## 2021-09-01 NOTE — Telephone Encounter (Signed)
Outreach made to Pt.  He states he is going to ER for evaluation.  He states he tried vagal maneuvers with no success.  He thinks his heart rate may be slowing now.  Advised would follow and call in the AM.

## 2021-09-01 NOTE — ED Provider Triage Note (Signed)
Emergency Medicine Provider Triage Evaluation Note  Spencer Thomas , a 56 y.o. male  was evaluated in triage.  Pt complains of racing heart.  History of paroxysmal SVT.  Patient had an ablation a year ago has not had any issues however today was at work here at [employer] and had onset of racing heart lasting about 1 hour.  Heart rate was approximately 170 for the entire time and has resolved.  He denies chest pain shortness of breath or feelings of syncope..  Review of Systems  Positive: Racing heart Negative: Chest pain  Physical Exam  BP (!) 159/107 (BP Location: Right Arm)   Pulse 85   Temp 98.3 F (36.8 C)   Resp 16   Ht 5\' 10"  (1.778 m)   Wt 120 kg   SpO2 99%   BMI 37.96 kg/m  Gen:   Awake, no distress   Resp:  Normal effort  MSK:   Moves extremities without difficulty  Other:  Regular rate and rhythm  Medical Decision Making  Medically screening exam initiated at 2:02 PM.  Appropriate orders placed.  Spencer Thomas was informed that the remainder of the evaluation will be completed by another provider, this initial triage assessment does not replace that evaluation, and the importance of remaining in the ED until their evaluation is complete.  Work-up initiated   Sloan Leiter, PA-C 09/01/21 1402

## 2021-09-02 NOTE — Progress Notes (Unsigned)
Office Visit    Patient Name: Spencer Thomas Date of Encounter: 09/03/2021  Primary Care Provider:  de Peru, Buren Kos, MD Primary Cardiologist:  Armanda Magic, MD Primary Electrophysiologist: None  Chief Complaint    Spencer Thomas is a 56 y.o. male with PMH of SVT, bradycardia, HTN, HLD, obesity, CKD stage II, OSA (on BiPAP) who presents today for hospital follow-up of paroxysmal SVT.  Past Medical History    Past Medical History:  Diagnosis Date   Bradycardia    nocturnal   CKD (chronic kidney disease), stage II    HTN (hypertension)    Hyperlipemia    PSVT (paroxysmal supraventricular tachycardia) (HCC)    Past Surgical History:  Procedure Laterality Date   APPENDECTOMY     CORONARY ANGIOGRAPHY N/A 03/04/2020   Procedure: CORONARY ANGIOGRAPHY (CATH LAB);  Surgeon: Runell Gess, MD;  Location: [facility] INVASIVE CV LAB;  Service: Cardiovascular;  Laterality: N/A;   SVT ABLATION N/A 06/20/2020   Procedure: SVT ABLATION;  Surgeon: Hillis Range, MD;  Location: [facility] INVASIVE CV LAB;  Service: Cardiovascular;  Laterality: N/A;    Allergies  Allergies  Allergen Reactions   Crestor [Rosuvastatin] Nausea And Vomiting    Myalgia also    History of Present Illness    Spencer Thomas is a 56 year old male with the above-mentioned past medical history who presents today for follow-up of SVT.  Spencer Thomas was initially seen in the ED 02/2020 for complaint of left-sided chest pain with sensation of radiating to both arms.  He had negative cardiac markers with some abnormalities on EKG but no criteria for NSTEMI.  Patient was scheduled for coronary CTA but was aborted due to SVT occurring prior to IV Lopressor being given.  He spontaneously converted to NSR with no recurrence of chest pain.  2D echo was performed with EF of 60-65%, grade 1 DD with mild dilation of aortic root measuring 38 mm and no evidence of AV stenosis.  Patient underwent LHC to further define coronary anatomy that revealed  normal coronaries and normal LV function.  Patient was discharged in stable condition with Toprol-XL 25 prescribed for SVT.  Patient was also set up for event monitor that showed autotriggering for nocturnal sinus bradycardia with a 4 sec pause and 1st degree AVB.  Sleep study was performed that revealed OSA that is not treated by BiPAP.  Patient was referred to EP for consideration of SVT ablation.  He was seen by Dr. Johney Frame and SVT ablation was performed 06/20/2020.  Patient had no recurrence until yesterday when he presented to [facility] ED with complaint of tachycardia.  Heart rate was noted to be in the 170s and patient denied any syncope or CP during event.   Spencer Thomas presents today following ED visit for tachycardia.  Today he denies any palpitations or shortness of breath.  He states that the tachycardia occurred during lunch when he noticed an elevated heart rate and checked his Apple Watch that verified heart rate of 170.  He was seen in the emergency room however left AMA due to increased wait times.  He denies any new dietary supplements or medications.  Patient denies chest pain, palpitations, dyspnea, PND, orthopnea, nausea, vomiting, dizziness, syncope, edema, weight gain, or early satiety.  Patient noted elevated blood pressure that was taken incorrectly at PCP office however well controlled currently.   Home Medications    Current Outpatient Medications  Medication Sig Dispense Refill   Alirocumab (PRALUENT) 150  MG/ML SOAJ Inject 1 mL into the skin every 14 (fourteen) days. 6 mL 3   cholecalciferol (VITAMIN D3) 25 MCG (1000 UNIT) tablet Take 1,000 Units by mouth daily.     ibuprofen (ADVIL) 800 MG tablet Take 1 tablet (800 mg total) by mouth every 8 (eight) hours as needed. 60 tablet 3   ipratropium (ATROVENT) 0.06 % nasal spray Place 2 sprays into both nostrils 4 (four) times daily. As needed for nasal congestion, runny nose 15 mL 0   MAGNESIUM PO Take 1 tablet by mouth daily.      metoprolol succinate (TOPROL-XL) 50 MG 24 hr tablet Take 1 tablet (50 mg total) by mouth daily, take 1 extra tablet only as needed for palpitations. Take with or immediately following a meal. 180 tablet 1   Multiple Vitamin (MULTI VITAMIN DAILY PO) Take 1 tablet by mouth daily.     cetirizine (ZYRTEC ALLERGY) 10 MG tablet Take 1 tablet (10 mg total) by mouth at bedtime. 30 tablet 2   famotidine (PEPCID) 20 MG tablet Take 1 tablet (20 mg total) by mouth 2 (two) times daily. 60 tablet 1   No current facility-administered medications for this visit.     Review of Systems  Please see the history of present illness.      All other systems reviewed and are otherwise negative except as noted above.  Physical Exam    Wt Readings from Last 3 Encounters:  09/03/21 268 lb 6.4 oz (121.7 kg)  09/01/21 264 lb 8.8 oz (120 kg)  08/06/21 265 lb 3.2 oz (120.3 kg)   VS: Vitals:   09/03/21 1354  BP: 112/82  Pulse: 80  ,Body mass index is 38.51 kg/m.  Constitutional:      Appearance: Healthy appearance. Not in distress.  Neck:     Vascular: JVD normal.  Pulmonary:     Effort: Pulmonary effort is normal.     Breath sounds: No wheezing. No rales. Diminished in the bases Cardiovascular:     Normal rate. Regular rhythm. Normal S1. Normal S2.      Murmurs: There is no murmur.  Edema:    Peripheral edema absent.  Abdominal:     Palpations: Abdomen is soft non tender. There is no hepatomegaly.  Skin:    General: Skin is warm and dry.  Neurological:     General: No focal deficit present.     Mental Status: Alert and oriented to person, place and time.     Cranial Nerves: Cranial nerves are intact.  EKG/LABS/Other Studies Reviewed    ECG personally reviewed by me today -none completed today  Lab Results  Component Value Date   WBC 5.5 09/01/2021   HGB 16.9 09/01/2021   HCT 49.9 09/01/2021   MCV 86.9 09/01/2021   PLT 198 09/01/2021   Lab Results  Component Value Date   CREATININE  1.54 (H) 09/01/2021   BUN 14 09/01/2021   NA 141 09/01/2021   K 3.8 09/01/2021   CL 106 09/01/2021   CO2 26 09/01/2021   Lab Results  Component Value Date   ALT 38 08/20/2020   AST 40 08/05/2020   ALKPHOS 68 08/05/2020   BILITOT 0.9 08/05/2020   Lab Results  Component Value Date   CHOL 146 11/27/2020   HDL 38 (L) 11/27/2020   LDLCALC 81 11/27/2020   TRIG 152 (H) 11/27/2020   CHOLHDL 3.8 11/27/2020    Lab Results  Component Value Date   HGBA1C 5.0 03/02/2020  Assessment & Plan    1.  Paroxysmal SVT: - s/p SVT ablation by Dr. Rayann Heman in 2022 with no recurrence until yesterday -Patient seen in the ED with complaint of elevated heart rate in the 170s asymptomatic. -Patient with no complaint of palpitations today and verified by auscultation. -14-day ZIO monitor to evaluate SVT burden -Start Toprol XL 50 mg daily with half tab as needed for breakthrough palpitations  2.  Hypertension: -Blood pressure today was controlled at 112/82 -Continue low-sodium heart healthy diet  3.  Obstructive sleep apnea: -Patient currently on BiPAP  4.  Hyperlipidemia: -Patient's last LDL was 81 at goal of less than 100 -Continue Praluent 150 mg  Disposition: Follow-up with Fransico Him, MD or APP in 1 months    Medication Adjustments/Labs and Tests Ordered: Current medicines are reviewed at length with the patient today.  Concerns regarding medicines are outlined above.   Signed, Mable Fill, Marissa Nestle, NP 09/03/2021, 4:08 PM [place]

## 2021-09-03 ENCOUNTER — Other Ambulatory Visit (HOSPITAL_COMMUNITY): Payer: Self-pay

## 2021-09-03 ENCOUNTER — Ambulatory Visit (INDEPENDENT_AMBULATORY_CARE_PROVIDER_SITE_OTHER): Payer: 59

## 2021-09-03 ENCOUNTER — Encounter: Payer: Self-pay | Admitting: Nurse Practitioner

## 2021-09-03 ENCOUNTER — Ambulatory Visit: Payer: 59 | Admitting: Nurse Practitioner

## 2021-09-03 VITALS — BP 112/82 | HR 80 | Ht 70.0 in | Wt 268.4 lb

## 2021-09-03 DIAGNOSIS — I471 Supraventricular tachycardia: Secondary | ICD-10-CM | POA: Diagnosis not present

## 2021-09-03 DIAGNOSIS — E785 Hyperlipidemia, unspecified: Secondary | ICD-10-CM

## 2021-09-03 DIAGNOSIS — G4733 Obstructive sleep apnea (adult) (pediatric): Secondary | ICD-10-CM

## 2021-09-03 DIAGNOSIS — I10 Essential (primary) hypertension: Secondary | ICD-10-CM

## 2021-09-03 MED ORDER — METOPROLOL SUCCINATE ER 50 MG PO TB24
50.0000 mg | ORAL_TABLET | Freq: Every day | ORAL | 1 refills | Status: DC
  Filled 2021-09-03: qty 90, 90d supply, fill #0

## 2021-09-03 NOTE — Patient Instructions (Addendum)
Medication Instructions:  Your physician has recommended you make the following change in your medication:   START Toprol Xl 50 mg taking 1 tablet daily and you may take 1 extra tablet daily as needed for palpitations  *If you need a refill on your cardiac medications before your next appointment, please call your pharmacy*   Lab Work: None ordered  If you have labs (blood work) drawn today and your tests are completely normal, you will receive your results only by: MyChart Message (if you have MyChart) OR A paper copy in the mail If you have any lab test that is abnormal or we need to change your treatment, we will call you to review the results.   Testing/Procedures: Christena Deem- Long Term Monitor Instructions  Your physician has requested you wear a ZIO patch monitor for 14 days.  This is a single patch monitor. [employer] supplies one patch monitor per enrollment. Additional stickers are not available. Please do not apply patch if you will be having a Nuclear Stress Test,  Echocardiogram, Cardiac CT, MRI, or Chest Xray during the period you would be wearing the  monitor. The patch cannot be worn during these tests. You cannot remove and re-apply the  ZIO XT patch monitor.  Your ZIO patch monitor will be mailed 3 day USPS to your address on file. It may take 3-5 days  to receive your monitor after you have been enrolled.  Once you have received your monitor, please review the enclosed instructions. Your monitor  has already been registered assigning a specific monitor serial # to you.  Billing and Patient Assistance Program Information  We have supplied [employer] with any of your insurance information on file for billing purposes. [employer] offers a sliding scale Patient Assistance Program for patients that do not have  insurance, or whose insurance does not completely cover the cost of the ZIO monitor.  You must apply for the Patient Assistance Program to qualify for this discounted rate.   To apply, please call [employer] at [phone], select option 4, select option 2, ask to apply for  Patient Assistance Program. Meredeth Ide will ask your household income, and how many people  are in your household. They will quote your out-of-pocket cost based on that information.  [employer] will also be able to set up a 22-month, interest-free payment plan if needed.  Applying the monitor   Shave hair from upper left chest.  Hold abrader disc by orange tab. Rub abrader in 40 strokes over the upper left chest as  indicated in your monitor instructions.  Clean area with 4 enclosed alcohol pads. Let dry.  Apply patch as indicated in monitor instructions. Patch will be placed under collarbone on left  side of chest with arrow pointing upward.  Rub patch adhesive wings for 2 minutes. Remove white label marked "1". Remove the white  label marked "2". Rub patch adhesive wings for 2 additional minutes.  While looking in a mirror, press and release button in center of patch. A small green light will  flash 3-4 times. This will be your only indicator that the monitor has been turned on.  Do not shower for the first 24 hours. You may shower after the first 24 hours.  Press the button if you feel a symptom. You will hear a small click. Record Date, Time and  Symptom in the Patient Logbook.  When you are ready to remove the patch, follow instructions on the last 2 pages of Patient  Logbook. Stick  patch monitor onto the last page of Patient Logbook.  Place Patient Logbook in the blue and white box. Use locking tab on box and tape box closed  securely. The blue and white box has prepaid postage on it. Please place it in the mailbox as  soon as possible. Your physician should have your test results approximately 7 days after the  monitor has been mailed back to [facility].  Call [facility] Customer Care at [phone] if you have questions regarding  your ZIO XT patch monitor. Call them immediately  if you see an orange light blinking on your  monitor.  If your monitor falls off in less than 4 days, contact our Monitor department at [phone].  If your monitor becomes loose or falls off after 4 days call [employer] at [phone] for  suggestions on securing your monitor    Follow-Up: At [facility], you and your health needs are our priority.  As part of our continuing mission to provide you with exceptional heart care, we have created designated Provider Care Teams.  These Care Teams include your primary Cardiologist (physician) and Advanced Practice Providers (APPs -  Physician Assistants and Nurse Practitioners) who all work together to provide you with the care you need, when you need it.  We recommend signing up for the patient portal called "MyChart".  Sign up information is provided on this After Visit Summary.  MyChart is used to connect with patients for Virtual Visits (Telemedicine).  Patients are able to view lab/test results, encounter notes, upcoming appointments, etc.  Non-urgent messages can be sent to your provider as well.   To learn more about what you can do with MyChart, go to [email or web address].    Your next appointment:   Based upon your visit with Dr. Merla Riches  The format for your next appointment:   In Person  Provider:   Robin Searing, NP         Other Instructions   Important Information About Sugar

## 2021-09-03 NOTE — Progress Notes (Unsigned)
Enrolled for [employer] to mail a ZIO XT long term holter monitor to the patients address on file.   Dr. Turner to read. 

## 2021-09-06 DIAGNOSIS — I471 Supraventricular tachycardia: Secondary | ICD-10-CM | POA: Diagnosis not present

## 2021-09-08 ENCOUNTER — Ambulatory Visit (HOSPITAL_BASED_OUTPATIENT_CLINIC_OR_DEPARTMENT_OTHER): Payer: 59

## 2021-09-08 DIAGNOSIS — Z0000 Encounter for general adult medical examination without abnormal findings: Secondary | ICD-10-CM | POA: Diagnosis not present

## 2021-09-09 LAB — CBC WITH DIFFERENTIAL/PLATELET
Basophils Absolute: 0.1 10*3/uL (ref 0.0–0.2)
Basos: 1 %
EOS (ABSOLUTE): 0.2 10*3/uL (ref 0.0–0.4)
Eos: 3 %
Hematocrit: 50.6 % (ref 37.5–51.0)
Hemoglobin: 16.3 g/dL (ref 13.0–17.7)
Immature Grans (Abs): 0 10*3/uL (ref 0.0–0.1)
Immature Granulocytes: 0 %
Lymphocytes Absolute: 2.6 10*3/uL (ref 0.7–3.1)
Lymphs: 35 %
MCH: 28.3 pg (ref 26.6–33.0)
MCHC: 32.2 g/dL (ref 31.5–35.7)
MCV: 88 fL (ref 79–97)
Monocytes Absolute: 0.7 10*3/uL (ref 0.1–0.9)
Monocytes: 10 %
Neutrophils Absolute: 3.7 10*3/uL (ref 1.4–7.0)
Neutrophils: 51 %
Platelets: 219 10*3/uL (ref 150–450)
RBC: 5.76 x10E6/uL (ref 4.14–5.80)
RDW: 13.4 % (ref 11.6–15.4)
WBC: 7.3 10*3/uL (ref 3.4–10.8)

## 2021-09-09 LAB — COMPREHENSIVE METABOLIC PANEL
ALT: 20 IU/L (ref 0–44)
AST: 22 IU/L (ref 0–40)
Albumin/Globulin Ratio: 1.7 (ref 1.2–2.2)
Albumin: 4.4 g/dL (ref 3.8–4.9)
Alkaline Phosphatase: 56 IU/L (ref 44–121)
BUN/Creatinine Ratio: 9 (ref 9–20)
BUN: 13 mg/dL (ref 6–24)
Bilirubin Total: 0.7 mg/dL (ref 0.0–1.2)
CO2: 23 mmol/L (ref 20–29)
Calcium: 9.6 mg/dL (ref 8.7–10.2)
Chloride: 104 mmol/L (ref 96–106)
Creatinine, Ser: 1.5 mg/dL — ABNORMAL HIGH (ref 0.76–1.27)
Globulin, Total: 2.6 g/dL (ref 1.5–4.5)
Glucose: 107 mg/dL — ABNORMAL HIGH (ref 70–99)
Potassium: 4 mmol/L (ref 3.5–5.2)
Sodium: 142 mmol/L (ref 134–144)
Total Protein: 7 g/dL (ref 6.0–8.5)
eGFR: 55 mL/min/{1.73_m2} — ABNORMAL LOW (ref >59)

## 2021-09-09 LAB — HEMOGLOBIN A1C
Est. average glucose Bld gHb Est-mCnc: 111 mg/dL
Hgb A1c MFr Bld: 5.5 % (ref 4.8–5.6)

## 2021-09-09 LAB — LIPID PANEL
Chol/HDL Ratio: 4 ratio (ref 0.0–5.0)
Cholesterol, Total: 143 mg/dL (ref 100–199)
HDL: 36 mg/dL — ABNORMAL LOW (ref >39)
LDL Chol Calc (NIH): 78 mg/dL (ref 0–99)
Triglycerides: 171 mg/dL — ABNORMAL HIGH (ref 0–149)
VLDL Cholesterol Cal: 29 mg/dL (ref 5–40)

## 2021-09-09 LAB — TSH RFX ON ABNORMAL TO FREE T4: TSH: 2.98 u[IU]/mL (ref 0.450–4.500)

## 2021-09-12 ENCOUNTER — Ambulatory Visit: Payer: 59 | Admitting: Internal Medicine

## 2021-09-17 ENCOUNTER — Other Ambulatory Visit (HOSPITAL_COMMUNITY): Payer: Self-pay

## 2021-09-17 ENCOUNTER — Encounter (HOSPITAL_BASED_OUTPATIENT_CLINIC_OR_DEPARTMENT_OTHER): Payer: Self-pay | Admitting: Family Medicine

## 2021-09-17 ENCOUNTER — Ambulatory Visit (INDEPENDENT_AMBULATORY_CARE_PROVIDER_SITE_OTHER): Payer: 59 | Admitting: Family Medicine

## 2021-09-17 VITALS — BP 121/80 | HR 69 | Temp 97.6°F | Ht 70.0 in | Wt 269.5 lb

## 2021-09-17 DIAGNOSIS — Z23 Encounter for immunization: Secondary | ICD-10-CM | POA: Diagnosis not present

## 2021-09-17 DIAGNOSIS — Z0000 Encounter for general adult medical examination without abnormal findings: Secondary | ICD-10-CM

## 2021-09-17 MED ORDER — SHINGRIX 50 MCG/0.5ML IM SUSR
0.5000 mL | INTRAMUSCULAR | 1 refills | Status: DC
Start: 2021-09-17 — End: 2022-03-31
  Filled 2021-09-17 – 2021-10-06 (×2): qty 0.5, 1d supply, fill #0
  Filled 2021-12-09: qty 0.5, 1d supply, fill #1

## 2021-09-17 NOTE — Patient Instructions (Signed)
  Medication Instructions:  Your physician recommends that you continue on your current medications as directed. Please refer to the Current Medication list given to you today. --If you need a refill on any your medications before your next appointment, please call your pharmacy first. If no refills are authorized on file call the office.-- Lab Work: Your physician has recommended that you have lab work today: No If you have labs (blood work) drawn today and your tests are completely normal, you will receive your results via MyChart message OR a phone call from our staff.  Please ensure you check your voicemail in the event that you authorized detailed messages to be left on a delegated number. If you have any lab test that is abnormal or we need to change your treatment, we will call you to review the results.  Referrals/Procedures/Imaging: No  Follow-Up: Your next appointment:   Your physician recommends that you schedule a follow-up appointment in: 6 month with Dr. de Cuba.  You will receive a text message or e-mail with a link to a survey about your care and experience with us today! We would greatly appreciate your feedback!   Thanks for letting us be apart of your health journey!!  Primary Care and Sports Medicine   Dr. Raymond de Cuba   We encourage you to activate your patient portal called "MyChart".  Sign up information is provided on this After Visit Summary.  MyChart is used to connect with patients for Virtual Visits (Telemedicine).  Patients are able to view lab/test results, encounter notes, upcoming appointments, etc.  Non-urgent messages can be sent to your provider as well. To learn more about what you can do with MyChart, please visit --  [email or web address].    

## 2021-09-17 NOTE — Progress Notes (Signed)
Subjective:    CC: Annual Physical Exam  HPI:  Spencer Thomas is a 56 y.o. presenting for annual physical  I reviewed the past medical history, family history, social history, surgical history, and allergies today and no changes were needed.  Please see the problem list section below in epic for further details.  Past Medical History: Past Medical History:  Diagnosis Date   Bradycardia    nocturnal   CKD (chronic kidney disease), stage II    HTN (hypertension)    Hyperlipemia    PSVT (paroxysmal supraventricular tachycardia) (HCC)    Past Surgical History: Past Surgical History:  Procedure Laterality Date   APPENDECTOMY     CORONARY ANGIOGRAPHY N/A 03/04/2020   Procedure: CORONARY ANGIOGRAPHY (CATH LAB);  Surgeon: Runell Gess, MD;  Location: [facility] INVASIVE CV LAB;  Service: Cardiovascular;  Laterality: N/A;   SVT ABLATION N/A 06/20/2020   Procedure: SVT ABLATION;  Surgeon: Hillis Range, MD;  Location: [facility] INVASIVE CV LAB;  Service: Cardiovascular;  Laterality: N/A;   Social History: Social History   Socioeconomic History   Marital status: Single    Spouse name: Not on file   Number of children: Not on file   Years of education: Not on file   Highest education level: Not on file  Occupational History   Not on file  Tobacco Use   Smoking status: Never   Smokeless tobacco: Never  Vaping Use   Vaping Use: Never used  Substance and Sexual Activity   Alcohol use: Not Currently   Drug use: Never   Sexual activity: Not on file  Other Topics Concern   Not on file  Social History Narrative   Not on file   Social Determinants of Health   Financial Resource Strain: Not on file  Food Insecurity: Not on file  Transportation Needs: Not on file  Physical Activity: Not on file  Stress: Not on file  Social Connections: Not on file   Family History: Family History  Problem Relation Age of Onset   Diabetes Mother    Diabetes Brother    CAD Neg Hx        neg for  premature CAD   Allergies: Allergies  Allergen Reactions   Crestor [Rosuvastatin] Nausea And Vomiting    Myalgia also   Medications: See med rec.  Review of Systems: No headache, visual changes, nausea, vomiting, diarrhea, constipation, dizziness, abdominal pain, skin rash, fevers, chills, night sweats, swollen lymph nodes, weight loss, chest pain, body aches, joint swelling, muscle aches, shortness of breath, mood changes, visual or auditory hallucinations.  Objective:    BP 121/80   Pulse 69   Temp 97.6 F (36.4 C) (Oral)   Ht 5\' 10"  (1.778 m)   Wt 269 lb 8 oz (122.2 kg)   SpO2 100%   BMI 38.67 kg/m   General: Well Developed, well nourished, and in no acute distress.  Neuro: Alert and oriented x3, extra-ocular muscles intact, sensation grossly intact. Cranial nerves II through XII are intact, motor, sensory, and coordinative functions are all intact. HEENT: Normocephalic, atraumatic, pupils equal round reactive to light, neck supple, no masses, no lymphadenopathy, thyroid nonpalpable. Oropharynx, nasopharynx, external ear canals are unremarkable. Skin: Warm and dry, no rashes noted. Cardiac: Regular rate and rhythm, no murmurs rubs or gallops. Respiratory: Clear to auscultation bilaterally. Not using accessory muscles, speaking in full sentences. Abdominal: Soft, nontender, nondistended, positive bowel sounds, no masses, no organomegaly. Musculoskeletal: Shoulder, elbow, wrist, hip, knee, ankle stable, and  with full range of motion.  Impression and Recommendations:    Wellness examination Routine HCM labs reviewed. HCM reviewed/discussed. Anticipatory guidance regarding healthy weight, lifestyle and choices given. Recommend healthy diet.  Recommend approximately 150 minutes/week of moderate intensity exercise Recommend regular dental and vision exams Always use seatbelt/lap and shoulder restraints Recommend using smoke alarms and checking batteries at least twice a  year Recommend using sunscreen when outside Discussed colon cancer screening recommendations, options.  Patient reports having colonoscopy completed through [facility] about 4 years ago.  Will request records from [facility] to review for next recommended colonoscopy Discussed recommendations for shingles vaccine.  Patient amenable, Rx sent Discussed tetanus immunization recommendations, patient is UTD  Return in about 6 months (around 03/20/2022) for HTN.   ___________________________________________ Mylinda Brook de Peru, MD, ABFM, CAQSM Primary Care and Sports Medicine [facility]

## 2021-09-17 NOTE — Assessment & Plan Note (Addendum)
Routine HCM labs reviewed. HCM reviewed/discussed. Anticipatory guidance regarding healthy weight, lifestyle and choices given. Recommend healthy diet.  Recommend approximately 150 minutes/week of moderate intensity exercise Recommend regular dental and vision exams Always use seatbelt/lap and shoulder restraints Recommend using smoke alarms and checking batteries at least twice a year Recommend using sunscreen when outside Discussed colon cancer screening recommendations, options.  Patient reports having colonoscopy completed through [facility] about 4 years ago.  Will request records from [facility] to review for next recommended colonoscopy Discussed recommendations for shingles vaccine.  Patient amenable, Rx sent Discussed tetanus immunization recommendations, patient is UTD

## 2021-09-24 NOTE — Progress Notes (Unsigned)
Office Visit    Patient Name: Spencer Thomas Date of Encounter: 10/01/2021  Primary Care Provider:  de Peru, Buren Kos, MD Primary Cardiologist:  Armanda Magic, MD Primary Electrophysiologist: None  Chief Complaint    Spencer Thomas is a 56 y.o. male with PMH of SVT, bradycardia, HTN, HLD, obesity, CKD stage II, OSA (on BiPAP) who presents today for hospital follow-up of paroxysmal SVT.  Past Medical History    Past Medical History:  Diagnosis Date   Bradycardia    nocturnal   CKD (chronic kidney disease), stage II    HTN (hypertension)    Hyperlipemia    PSVT (paroxysmal supraventricular tachycardia) (HCC)    Past Surgical History:  Procedure Laterality Date   APPENDECTOMY     CORONARY ANGIOGRAPHY N/A 03/04/2020   Procedure: CORONARY ANGIOGRAPHY (CATH LAB);  Surgeon: Runell Gess, MD;  Location: [facility] INVASIVE CV LAB;  Service: Cardiovascular;  Laterality: N/A;   SVT ABLATION N/A 06/20/2020   Procedure: SVT ABLATION;  Surgeon: Hillis Range, MD;  Location: [facility] INVASIVE CV LAB;  Service: Cardiovascular;  Laterality: N/A;    Allergies  Allergies  Allergen Reactions   Crestor [Rosuvastatin] Nausea And Vomiting    Myalgia also    History of Present Illness    Spencer Thomas is a 56 year old male with the above-mentioned past medical history who presents today for follow-up of SVT.  [name] was initially seen in the ED 02/2020 for complaint of left-sided chest pain with sensation of radiating to both arms.  He had negative cardiac markers with some abnormalities on EKG but no criteria for NSTEMI.  Patient was scheduled for coronary CTA but was aborted due to SVT occurring prior to IV Lopressor being given.  He spontaneously converted to NSR with no recurrence of chest pain.  2D echo was performed with EF of 60-65%, grade 1 DD with mild dilation of aortic root measuring 38 mm and no evidence of AV stenosis.  Patient underwent LHC to further define coronary anatomy that revealed  normal coronaries and normal LV function.  Patient was discharged in stable condition with Toprol-XL 25 prescribed for SVT.  Patient was also set up for event monitor that showed autotriggering for nocturnal sinus bradycardia with a 4 sec pause and 1st degree AVB.  Sleep study was performed that revealed OSA that is not treated by BiPAP. Patient was referred to EP for consideration of SVT ablation.  He was seen by Dr. Johney Frame and SVT ablation was performed 06/20/2020.  Patient had no recurrence until yesterday when he presented to [facility], ED with complaint of tachycardia.  Heart rate was noted to be in the 170s and patient denied any syncope or CP during event.  Patient was seen by me on 09/03/2021 following ED visit for SVT.  He was doing well overall during visit and was asymptomatic with elevated heart rate in the ED of 170 bpm.  He was started on Toprol 50 mg daily with as needed dose of 25 mg  for breakthrough palpitations.  Patient also wore 14-day ZIO monitor that demonstrated sinus with average beat of 73 bpm and 1 episode of SVT lasting 31 seconds.   [name] presents for 1 month follow-up of SVT.  He is here alone for his follow-up appointment.  He states that he has been doing well and has not experienced any breakthrough SVT episodes.  He did mention 1 episode near the end of his event monitor time that  was short-lived and resolved spontaneously.  His blood pressure today was well controlled at 104/66 and heart rate of 63.  He did mention increased dizziness and tiredness with his current dose of Toprol-XL 50 mg.  Patient denies chest pain, palpitations, dyspnea, PND, orthopnea, nausea, vomiting, dizziness, syncope, edema, weight gain, or early satiety.  Home Medications    Current Outpatient Medications  Medication Sig Dispense Refill   Alirocumab (PRALUENT) 150 MG/ML SOAJ Inject 1 mL into the skin every 14 (fourteen) days. 6 mL 3   cholecalciferol (VITAMIN D3) 25 MCG (1000 UNIT) tablet  Take 1,000 Units by mouth daily.     ibuprofen (ADVIL) 800 MG tablet Take 1 tablet (800 mg total) by mouth every 8 (eight) hours as needed. 60 tablet 3   MAGNESIUM PO Take 1 tablet by mouth daily.     metoprolol succinate (TOPROL XL) 25 MG 24 hr tablet Take 1 tablet (25 mg total) by mouth daily. 90 tablet 3   Multiple Vitamin (MULTI VITAMIN DAILY PO) Take 1 tablet by mouth daily.     Zoster Vaccine Adjuvanted [facility]) injection Inject 0.5 mLs into the muscle. Administer at 0 and 2-6 months for 2 total doses.  To be administered by pharmacy. 0.5 mL 1   cetirizine (ZYRTEC ALLERGY) 10 MG tablet Take 1 tablet (10 mg total) by mouth at bedtime. 30 tablet 2   famotidine (PEPCID) 20 MG tablet Take 1 tablet (20 mg total) by mouth 2 (two) times daily. 60 tablet 1   No current facility-administered medications for this visit.     Review of Systems  Please see the history of present illness.    (+) Tiredness (+) Occasional dizziness  All other systems reviewed and are otherwise negative except as noted above.  Physical Exam    Wt Readings from Last 3 Encounters:  10/01/21 271 lb (122.9 kg)  09/17/21 269 lb 8 oz (122.2 kg)  09/03/21 268 lb 6.4 oz (121.7 kg)   VS: Vitals:   10/01/21 1455  BP: 104/66  Pulse: 63  SpO2: 95%  ,Body mass index is 38.33 kg/m.  Constitutional:      Appearance: Healthy appearance. Not in distress.  Neck:     Vascular: JVD normal.  Pulmonary:     Effort: Pulmonary effort is normal.     Breath sounds: No wheezing. No rales. Diminished in the bases Cardiovascular:     Normal rate. Regular rhythm. Normal S1. Normal S2.      Murmurs: There is no murmur.  Edema:    Peripheral edema absent.  Abdominal:     Palpations: Abdomen is soft non tender. There is no hepatomegaly.  Skin:    General: Skin is warm and dry.  Neurological:     General: No focal deficit present.     Mental Status: Alert and oriented to person, place and time.     Cranial Nerves: Cranial  nerves are intact.  EKG/LABS/Other Studies Reviewed    ECG personally reviewed by me today -none completed today   Lab Results  Component Value Date   WBC 7.3 09/08/2021   HGB 16.3 09/08/2021   HCT 50.6 09/08/2021   MCV 88 09/08/2021   PLT 219 09/08/2021   Lab Results  Component Value Date   CREATININE 1.50 (H) 09/08/2021   BUN 13 09/08/2021   NA 142 09/08/2021   K 4.0 09/08/2021   CL 104 09/08/2021   CO2 23 09/08/2021   Lab Results  Component Value Date  ALT 20 09/08/2021   AST 22 09/08/2021   ALKPHOS 56 09/08/2021   BILITOT 0.7 09/08/2021   Lab Results  Component Value Date   CHOL 143 09/08/2021   HDL 36 (L) 09/08/2021   LDLCALC 78 09/08/2021   TRIG 171 (H) 09/08/2021   CHOLHDL 4.0 09/08/2021    Lab Results  Component Value Date   HGBA1C 5.5 09/08/2021    Assessment & Plan    1.  Paroxysmal SVT: - s/p SVT ablation by Dr. Johney Frame in 2022 with recurrence asymptomatic recurrence in July 2023. -14-day ZIO monitor reviewed and indicated underlying rhythm of sinus with 1 bout of SVT lasting 30 seconds. -Patient compliant with current medications however does report increased fatigue and occasional dizziness. -We will decrease Toprol-XL 50 mg to 25 mg daily -Patient encouraged to report any breakthrough episodes of SVT to our office.   2.  Hypertension: -Blood pressure today was well controlled at 104/66 -Continue low-sodium heart healthy diet   3.  Obstructive sleep apnea: -Patient currently on BiPAP   4.  Hyperlipidemia: -Patient's last LDL was 81 at goal of less than 100 -Continue Praluent 150 mg  Disposition: Follow-up with Armanda Magic, MD or APP in 6 months    Medication Adjustments/Labs and Tests Ordered: Current medicines are reviewed at length with the patient today.  Concerns regarding medicines are outlined above.   Signed, Napoleon Form, Leodis Rains, NP 10/01/2021, 3:42 PM [facility]

## 2021-09-29 DIAGNOSIS — I471 Supraventricular tachycardia: Secondary | ICD-10-CM | POA: Diagnosis not present

## 2021-10-01 ENCOUNTER — Encounter: Payer: Self-pay | Admitting: Nurse Practitioner

## 2021-10-01 ENCOUNTER — Ambulatory Visit: Payer: 59 | Admitting: Nurse Practitioner

## 2021-10-01 VITALS — BP 104/66 | HR 63 | Ht 70.5 in | Wt 271.0 lb

## 2021-10-01 DIAGNOSIS — I471 Supraventricular tachycardia, unspecified: Secondary | ICD-10-CM

## 2021-10-01 DIAGNOSIS — E785 Hyperlipidemia, unspecified: Secondary | ICD-10-CM | POA: Diagnosis not present

## 2021-10-01 DIAGNOSIS — I10 Essential (primary) hypertension: Secondary | ICD-10-CM | POA: Diagnosis not present

## 2021-10-01 DIAGNOSIS — G4733 Obstructive sleep apnea (adult) (pediatric): Secondary | ICD-10-CM

## 2021-10-01 MED ORDER — METOPROLOL SUCCINATE ER 25 MG PO TB24: 25.0000 mg | ORAL_TABLET | Freq: Every day | ORAL | 3 refills | Status: DC

## 2021-10-01 NOTE — Patient Instructions (Signed)
Medication Instructions:  Your physician has recommended you make the following change in your medication:  DECREASE METOPROLOL TO 25 MG  DAILY. *If you need a refill on your cardiac medications before your next appointment, please call your pharmacy*   Lab Work: NONE If you have labs (blood work) drawn today and your tests are completely normal, you will receive your results only by: MyChart Message (if you have MyChart) OR A paper copy in the mail If you have any lab test that is abnormal or we need to change your treatment, we will call you to review the results.   Testing/Procedures: NONE   Follow-Up: At [facility], you and your health needs are our priority.  As part of our continuing mission to provide you with exceptional heart care, we have created designated Provider Care Teams.  These Care Teams include your primary Cardiologist (physician) and Advanced Practice Providers (APPs -  Physician Assistants and Nurse Practitioners) who all work together to provide you with the care you need, when you need it.  We recommend signing up for the patient portal called "MyChart".  Sign up information is provided on this After Visit Summary.  MyChart is used to connect with patients for Virtual Visits (Telemedicine).  Patients are able to view lab/test results, encounter notes, upcoming appointments, etc.  Non-urgent messages can be sent to your provider as well.   To learn more about what you can do with MyChart, go to [email or web address].    Your next appointment:   6 month(s)  The format for your next appointment:   In Person  Provider:   Armanda Magic, MD {  Important Information About Sugar

## 2021-10-06 ENCOUNTER — Other Ambulatory Visit (HOSPITAL_COMMUNITY): Payer: Self-pay

## 2021-10-07 ENCOUNTER — Other Ambulatory Visit (HOSPITAL_COMMUNITY): Payer: Self-pay

## 2021-11-13 ENCOUNTER — Other Ambulatory Visit (HOSPITAL_COMMUNITY): Payer: Self-pay

## 2021-11-13 ENCOUNTER — Other Ambulatory Visit (HOSPITAL_BASED_OUTPATIENT_CLINIC_OR_DEPARTMENT_OTHER): Payer: Self-pay | Admitting: Family Medicine

## 2021-11-13 MED ORDER — VITAMIN D 25 MCG (1000 UNIT) PO TABS
1000.0000 [IU] | ORAL_TABLET | Freq: Every day | ORAL | 1 refills | Status: AC
  Filled 2021-11-13 – 2022-07-17 (×3): qty 90, 90d supply, fill #0

## 2021-11-25 ENCOUNTER — Other Ambulatory Visit (HOSPITAL_COMMUNITY): Payer: Self-pay

## 2021-11-28 ENCOUNTER — Other Ambulatory Visit (HOSPITAL_COMMUNITY): Payer: Self-pay

## 2021-12-04 DIAGNOSIS — G4733 Obstructive sleep apnea (adult) (pediatric): Secondary | ICD-10-CM | POA: Diagnosis not present

## 2021-12-09 ENCOUNTER — Other Ambulatory Visit (HOSPITAL_COMMUNITY): Payer: Self-pay

## 2021-12-10 ENCOUNTER — Other Ambulatory Visit (HOSPITAL_COMMUNITY): Payer: Self-pay

## 2021-12-23 ENCOUNTER — Encounter: Payer: Self-pay | Admitting: Surgery

## 2021-12-23 ENCOUNTER — Other Ambulatory Visit: Payer: Self-pay | Admitting: Cardiology

## 2021-12-23 ENCOUNTER — Other Ambulatory Visit (HOSPITAL_COMMUNITY): Payer: Self-pay

## 2021-12-23 ENCOUNTER — Ambulatory Visit (INDEPENDENT_AMBULATORY_CARE_PROVIDER_SITE_OTHER): Payer: 59 | Admitting: Surgery

## 2021-12-23 ENCOUNTER — Encounter: Payer: Self-pay | Admitting: Cardiology

## 2021-12-23 DIAGNOSIS — I2089 Other forms of angina pectoris: Secondary | ICD-10-CM

## 2021-12-23 DIAGNOSIS — E785 Hyperlipidemia, unspecified: Secondary | ICD-10-CM

## 2021-12-23 DIAGNOSIS — M545 Low back pain, unspecified: Secondary | ICD-10-CM

## 2021-12-23 MED ORDER — PRALUENT 150 MG/ML ~~LOC~~ SOAJ
1.0000 mL | SUBCUTANEOUS | 3 refills | Status: DC
  Filled 2021-12-23: qty 6, 84d supply, fill #0

## 2021-12-23 NOTE — Progress Notes (Signed)
Office Visit Note   Patient: Spencer Thomas           Date of Birth: 11/17/1965           MRN: [id number] Visit Date: 12/23/2021              Requested by: de Peru, Raymond J, MD [address] [place],  [place] [address] PCP: de Peru, Raymond J, MD   Assessment & Plan: Visit Diagnoses:  1. Low back pain, unspecified back pain laterality, unspecified chronicity, unspecified whether sciatica present     Plan: At this point will attempt conservative treatment.  Today in clinic patient was given Depo-Medrol 80 mg and Toradol 30 mg IM injections.  He has a TENS unit that he is currently using and he can continue this.  Can also use some heat do gentle stretching exercises.  Also instructed him to do some hamstring stretching.  I will see how he is feeling tomorrow but otherwise will follow-up in 1 week.  Follow-Up Instructions: Return in about 1 week (around 12/30/2021).   Orders:  No orders of the defined types were placed in this encounter.  No orders of the defined types were placed in this encounter.     Procedures: No procedures performed   Clinical Data: No additional findings.   Subjective: Chief Complaint  Patient presents with   Lower Back - Pain    HPI 56 year old black male comes in with complaints of low back pain.  Patient is an employee here in the office.  He was seen by Dr. Otelia Sergeant last in May 2023.  Documented that he has a known previous history of L4-5 disc extrusion seen on MRI 2010.  He was doing well since his last visit and has had a recent flareup of his low back pain this morning.  Denies any specific injury.  Pain localized to the low back nothing radiating down his legs.  Pain aggravated with ambulation and bending.    Objective: Vital Signs: There were no vitals taken for this visit.  Physical Exam Gait is somewhat antalgic.  Limited lumbar flexion extension due to pain.  Positive bilateral lumbar paraspinal tenderness/spasm.  Negative  straight leg raise. Tight hamstrings bilaterally.no focal motor deficits.  Ortho Exam  Specialty Comments:  No specialty comments available.  Imaging: No results found.   PMFS History: Patient Active Problem List   Diagnosis Date Noted   Wellness examination 09/17/2021   Mass of testicle 08/06/2021   Obstructive sleep apnea 08/06/2021   Adverse reaction to statin medication 09/13/2020   Sinus pause 06/26/2020   Snoring 06/26/2020   Angina at rest    SVT (supraventricular tachycardia)    Chest pain 03/01/2020   Hyperlipidemia 12/20/2019   Cardiac arrhythmia 12/20/2019   Palpitations 12/20/2019   Irregular heart rate 11/15/2019   Severe obesity (BMI 35.0-39.9) with comorbidity (HCC) 10/06/2018   Past Medical History:  Diagnosis Date   Bradycardia    nocturnal   CKD (chronic kidney disease), stage II    HTN (hypertension)    Hyperlipemia    PSVT (paroxysmal supraventricular tachycardia)     Family History  Problem Relation Age of Onset   Diabetes Mother    Diabetes Brother    CAD Neg Hx        neg for premature CAD    Past Surgical History:  Procedure Laterality Date   APPENDECTOMY     CORONARY ANGIOGRAPHY N/A 03/04/2020   Procedure: CORONARY ANGIOGRAPHY (CATH LAB);  Surgeon: Runell Gess, MD;  Location: [facility] INVASIVE CV LAB;  Service: Cardiovascular;  Laterality: N/A;   SVT ABLATION N/A 06/20/2020   Procedure: SVT ABLATION;  Surgeon: Hillis Range, MD;  Location: [facility] INVASIVE CV LAB;  Service: Cardiovascular;  Laterality: N/A;   Social History   Occupational History   Not on file  Tobacco Use   Smoking status: Never   Smokeless tobacco: Never  Vaping Use   Vaping Use: Never used  Substance and Sexual Activity   Alcohol use: Not Currently   Drug use: Never   Sexual activity: Not on file

## 2021-12-26 ENCOUNTER — Telehealth: Payer: Self-pay | Admitting: Pharmacist

## 2021-12-26 ENCOUNTER — Other Ambulatory Visit (HOSPITAL_COMMUNITY): Payer: Self-pay

## 2021-12-26 DIAGNOSIS — E785 Hyperlipidemia, unspecified: Secondary | ICD-10-CM

## 2021-12-26 DIAGNOSIS — I2089 Other forms of angina pectoris: Secondary | ICD-10-CM

## 2021-12-26 NOTE — Telephone Encounter (Signed)
PA for Praluent submitted.  Key: [id number]

## 2021-12-29 ENCOUNTER — Other Ambulatory Visit (HOSPITAL_COMMUNITY): Payer: Self-pay

## 2021-12-29 MED ORDER — PRALUENT 150 MG/ML ~~LOC~~ SOAJ
1.0000 mL | SUBCUTANEOUS | 3 refills | Status: DC
  Filled 2021-12-29: qty 6, 84d supply, fill #0

## 2021-12-29 NOTE — Addendum Note (Signed)
Addended by: Cheree Ditto on: 12/29/2021 12:44 PM   Modules accepted: Orders

## 2021-12-29 NOTE — Telephone Encounter (Signed)
Praluent PA approved through 12/29/22

## 2022-01-15 NOTE — Telephone Encounter (Signed)
Patient came by on 01/15/22 to drop off Hospital indemity form for Dr Mayford Knife to complete. Put in Dr Norris Cross box.

## 2022-01-22 NOTE — Telephone Encounter (Signed)
Forms have been completed and signed by Dr. Mayford Knife. Forms left up front with LeAnne. Patient still needs to complete the 2 step process (signing ROI and making $29 payment) before final completion. Medical records will also need to gather copies of testing, etc. MyChart message sent to patient making him aware of the steps needed for [place] to be able to finish the process.

## 2022-01-23 DIAGNOSIS — Z0279 Encounter for issue of other medical certificate: Secondary | ICD-10-CM

## 2022-01-30 ENCOUNTER — Other Ambulatory Visit (HOSPITAL_COMMUNITY): Payer: Self-pay

## 2022-02-03 ENCOUNTER — Other Ambulatory Visit (HOSPITAL_COMMUNITY): Payer: Self-pay

## 2022-03-05 IMAGING — DX DG CHEST 2V
2 series · 2 of 2 positions shown · non-contrast
Comparison: February 04, 2020.

CLINICAL DATA: Chest pain.

EXAM:
CHEST - 2 VIEW

[chest pa]
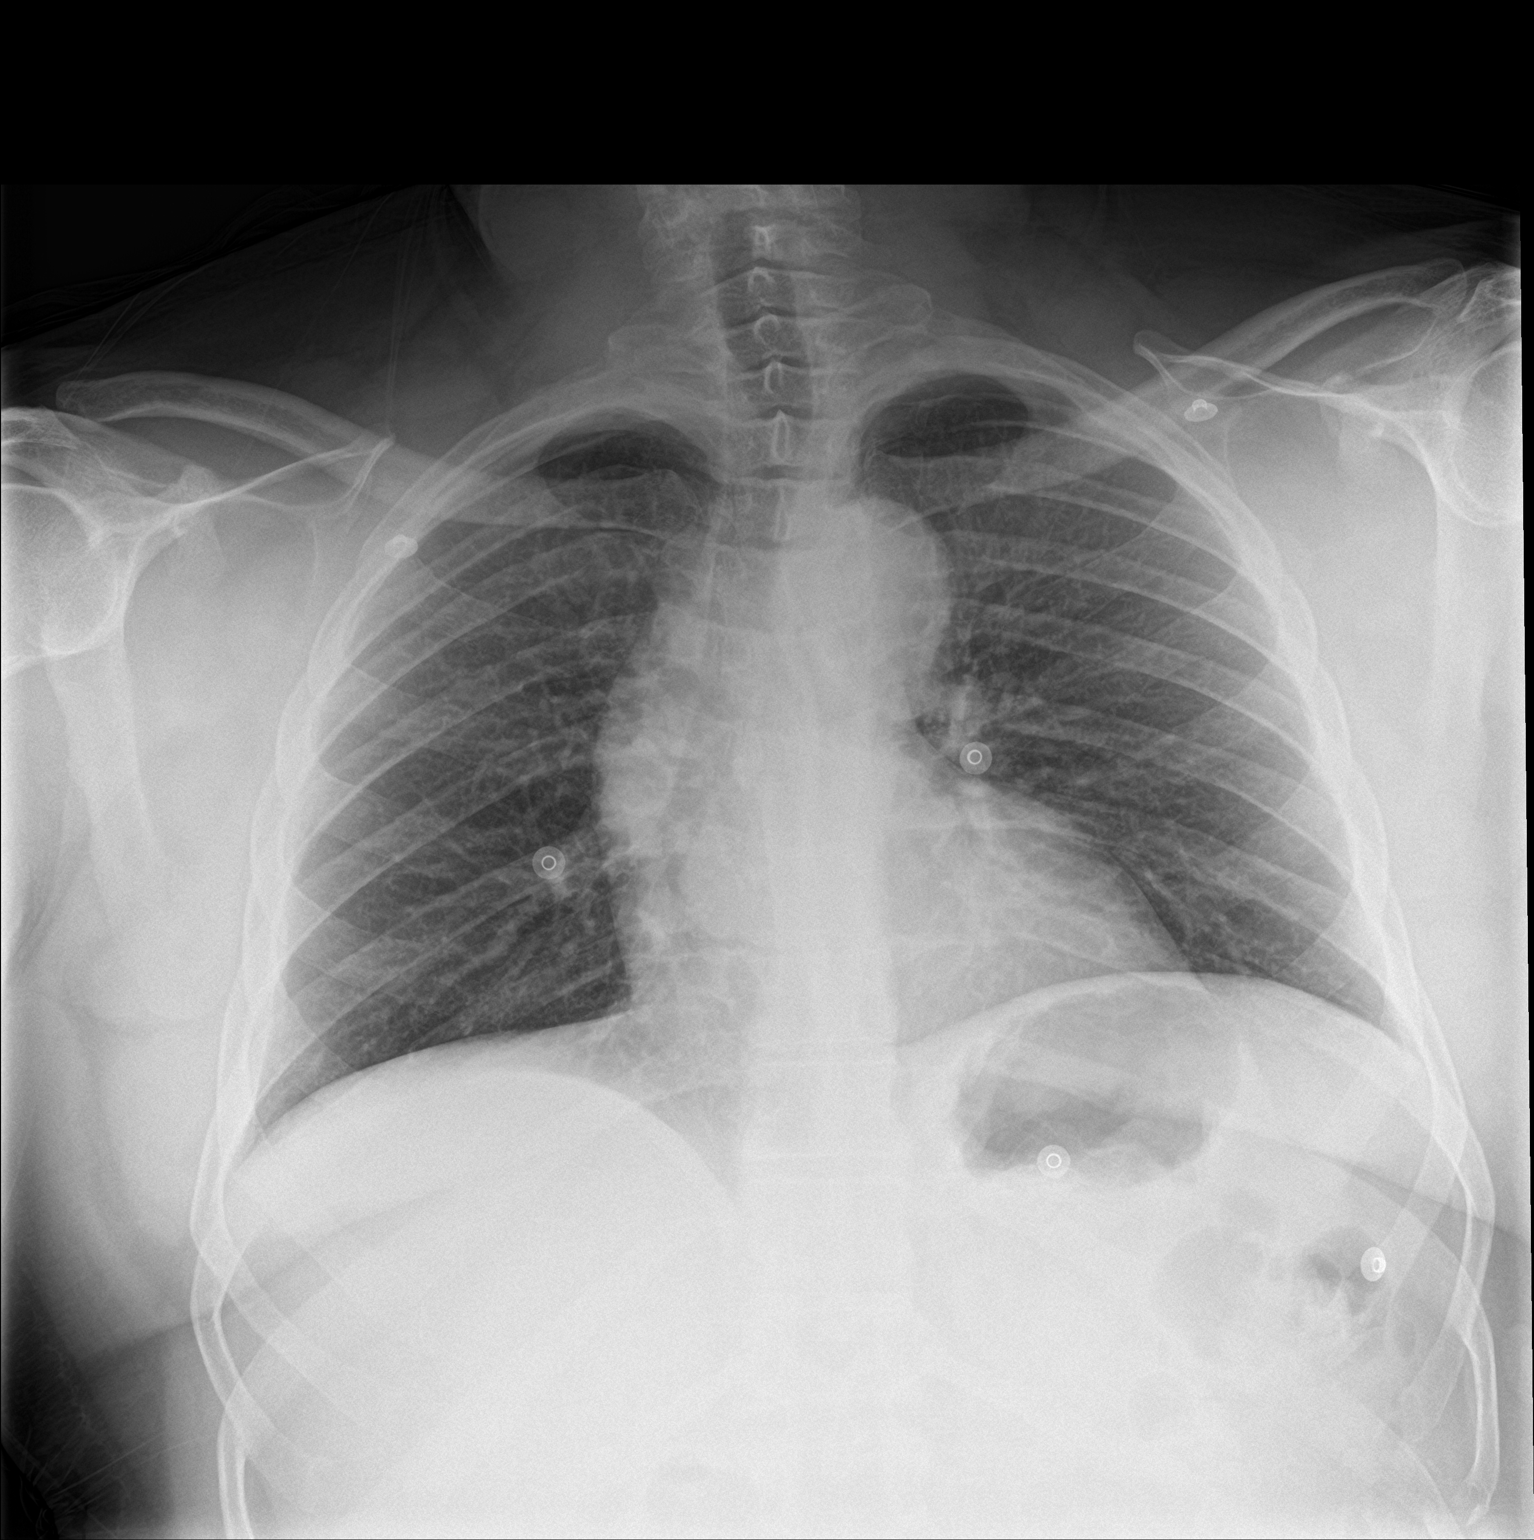

[chest lat]
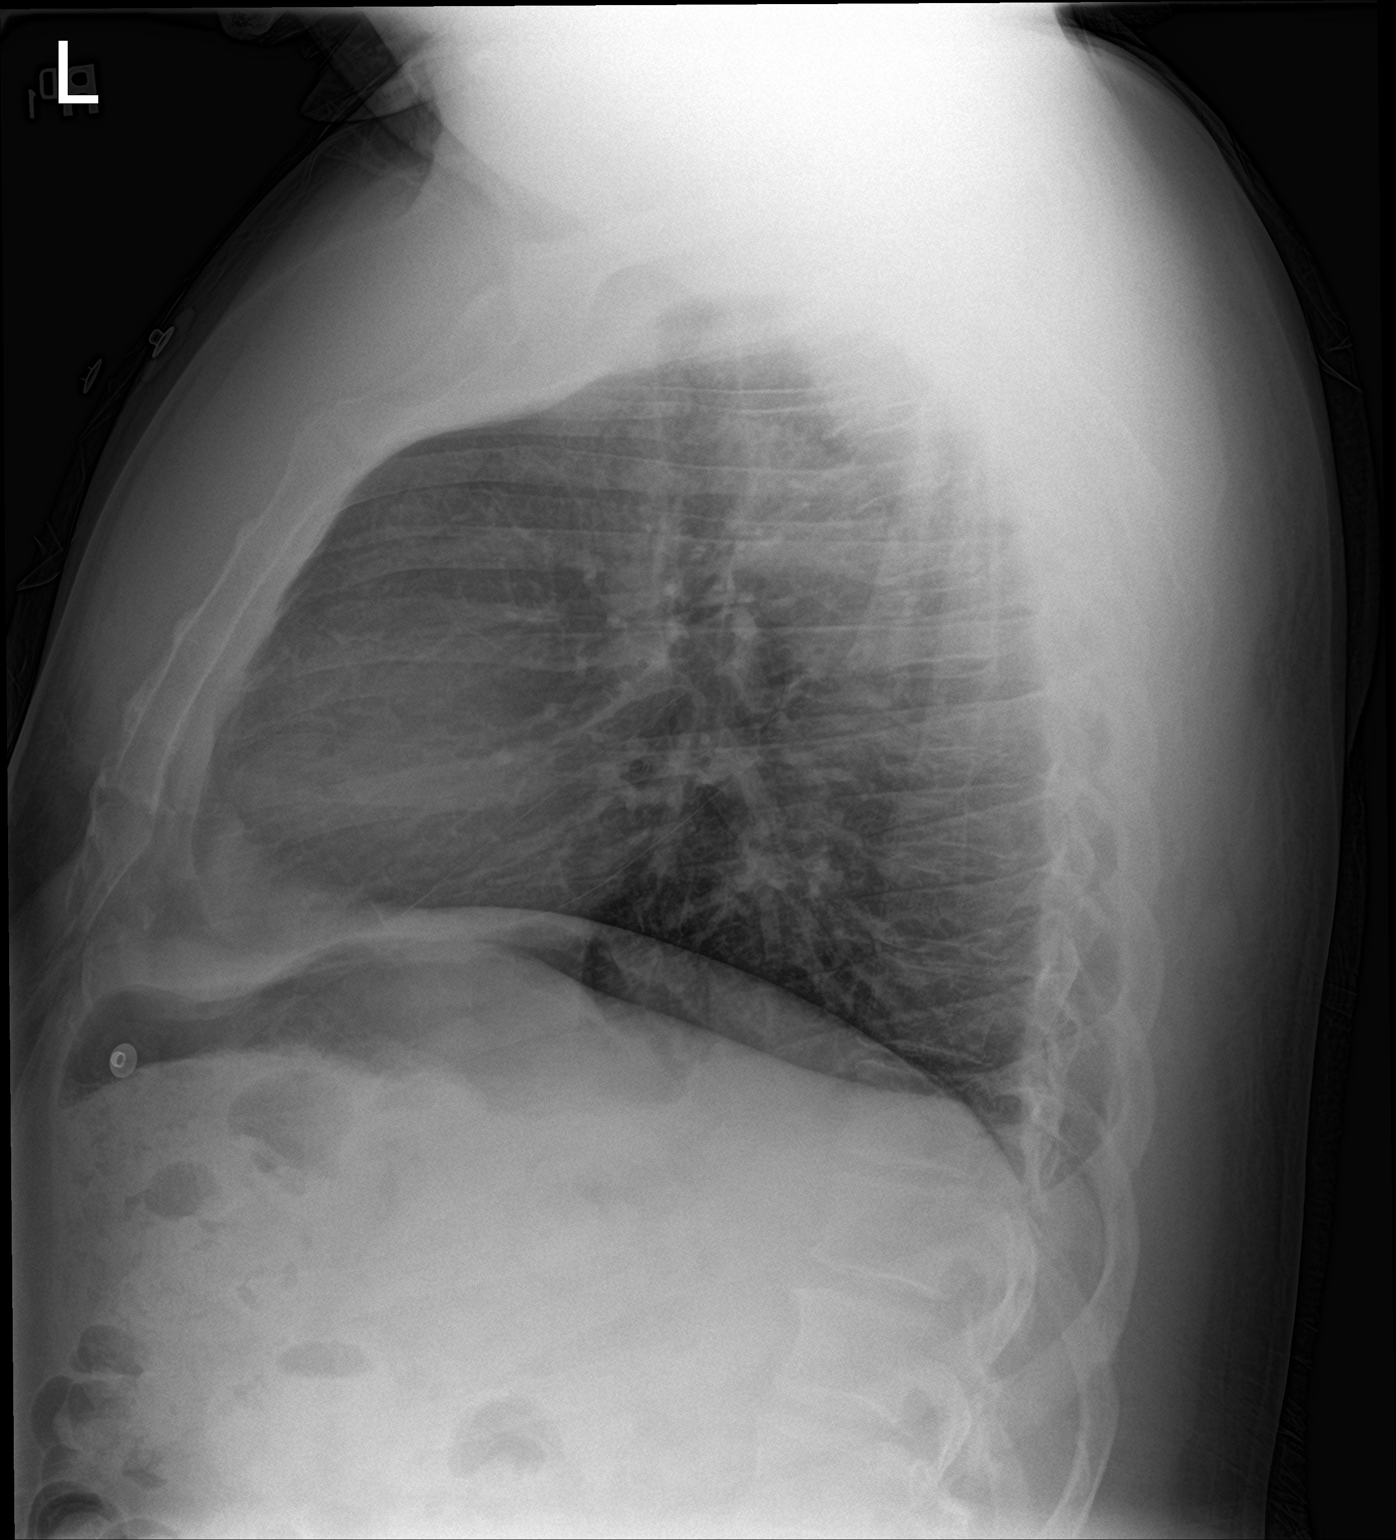

[2 of 2 positions shown; findings below may reference images not displayed]

FINDINGS: The heart size and mediastinal contours are within normal limits.
Both lungs are clear. No pneumothorax or pleural effusion is noted.
The visualized skeletal structures are unremarkable.
IMPRESSION: No active cardiopulmonary disease.

## 2022-03-13 ENCOUNTER — Encounter (HOSPITAL_BASED_OUTPATIENT_CLINIC_OR_DEPARTMENT_OTHER): Payer: Self-pay

## 2022-03-20 ENCOUNTER — Ambulatory Visit (HOSPITAL_BASED_OUTPATIENT_CLINIC_OR_DEPARTMENT_OTHER): Payer: 59 | Admitting: Family Medicine

## 2022-03-31 ENCOUNTER — Other Ambulatory Visit (HOSPITAL_COMMUNITY): Payer: Self-pay

## 2022-03-31 ENCOUNTER — Ambulatory Visit: Payer: Commercial Managed Care - PPO | Attending: Cardiology | Admitting: Cardiology

## 2022-03-31 ENCOUNTER — Encounter: Payer: Self-pay | Admitting: Cardiology

## 2022-03-31 VITALS — BP 110/78 | HR 77 | Ht 70.5 in | Wt 274.2 lb

## 2022-03-31 DIAGNOSIS — I471 Supraventricular tachycardia, unspecified: Secondary | ICD-10-CM

## 2022-03-31 DIAGNOSIS — E785 Hyperlipidemia, unspecified: Secondary | ICD-10-CM | POA: Diagnosis not present

## 2022-03-31 DIAGNOSIS — I2089 Other forms of angina pectoris: Secondary | ICD-10-CM | POA: Diagnosis not present

## 2022-03-31 DIAGNOSIS — G4733 Obstructive sleep apnea (adult) (pediatric): Secondary | ICD-10-CM

## 2022-03-31 DIAGNOSIS — R001 Bradycardia, unspecified: Secondary | ICD-10-CM | POA: Diagnosis not present

## 2022-03-31 DIAGNOSIS — I10 Essential (primary) hypertension: Secondary | ICD-10-CM

## 2022-03-31 MED ORDER — METOPROLOL SUCCINATE ER 25 MG PO TB24
25.0000 mg | ORAL_TABLET | Freq: Every day | ORAL | 3 refills | Status: DC
  Filled 2022-03-31: qty 90, 90d supply, fill #0
  Filled 2022-07-17: qty 90, 90d supply, fill #1
  Filled 2022-12-22: qty 90, 90d supply, fill #2

## 2022-03-31 MED ORDER — PRALUENT 150 MG/ML ~~LOC~~ SOAJ
1.0000 mL | SUBCUTANEOUS | 3 refills | Status: AC
  Filled 2022-03-31: qty 6, 84d supply, fill #0
  Filled 2022-07-17: qty 6, 84d supply, fill #1
  Filled 2022-12-22: qty 6, 84d supply, fill #2

## 2022-03-31 NOTE — Progress Notes (Addendum)
Cardiology Office Note:    Date:  03/31/2022   ID:  OCONNOR ERKER, DOB [birth year], MRN [id number]  PCP:  de [place], Raymond J, MD  Cardiologist:  Fransico Him, MD    Referring MD: de [place], Raymond J, MD   Chief Complaint  Patient presents with   Sleep Apnea   Hypertension    History of Present Illness:    Spencer Thomas is a 57 y.o. male with a hx of bradycardia, CKD stage II, hypertension, hyperlipidemia and PSVT.  He was referred for Hesleep study by Dr. Meda Coffee due to nocturnal bradycardia and excessive daytime sleepiness and non restorative sleep.  He says that his colleagues thought he had narcolepsy because he was always sleeping.  He was found to have severe obstructive sleep apnea with an AHI of 41.2 with nocturnal hypoxemia with lowest O2 saturation 73%.  He underwent CPAP titration but due to ongoing events could not be treated with CPAP and was changed to auto BiPAP with IPAP max 25 cm H2O, EPAP min 5 cm H2O and pressure support 4 cm H2O.  He is here today for followup and is doing well.  He denies any chest pain or pressure, SOB, DOE, PND, orthopnea, LE edema, dizziness, palpitations or syncope. He is compliant with his meds and is tolerating meds with no SE.    He recently stopped using his BiPAP.  He says that he does not like the BiPAP.  He says that when he wears it he gets congested and decided not to use it.   Past Medical History:  Diagnosis Date   Bradycardia    nocturnal   CKD (chronic kidney disease), stage II    HTN (hypertension)    Hyperlipemia    PSVT (paroxysmal supraventricular tachycardia)     Past Surgical History:  Procedure Laterality Date   APPENDECTOMY     CORONARY ANGIOGRAPHY N/A 03/04/2020   Procedure: CORONARY ANGIOGRAPHY (CATH LAB);  Surgeon: Lorretta Harp, MD;  Location: [facility];  Service: Cardiovascular;  Laterality: N/A;   SVT ABLATION N/A 06/20/2020   Procedure: SVT ABLATION;  Surgeon: Thompson Grayer, MD;  Location: [facility];  Service: Cardiovascular;  Laterality: N/A;    Current Medications: Current Meds  Medication Sig   Alirocumab (PRALUENT) 150 MG/ML SOAJ Inject 1 mL into the skin every 14 (fourteen) days.   cetirizine (ZYRTEC ALLERGY) 10 MG tablet Take 1 tablet (10 mg total) by mouth at bedtime.   cholecalciferol (VITAMIN D3) 25 MCG (1000 UNIT) tablet Take 1 tablet (1,000 Units total) by mouth daily.   ibuprofen (ADVIL) 800 MG tablet Take 1 tablet (800 mg total) by mouth every 8 (eight) hours as needed.   metoprolol succinate (TOPROL XL) 25 MG 24 hr tablet Take 1 tablet (25 mg total) by mouth daily.   Multiple Vitamin (MULTI VITAMIN DAILY PO) Take 1 tablet by mouth daily.   [DISCONTINUED] MAGNESIUM PO Take 1 tablet by mouth daily.   [DISCONTINUED] Zoster Vaccine Adjuvanted [facility]) injection Inject 0.5 mLs into the muscle. Administer at 0 and 2-6 months for 2 total doses.  To be administered by pharmacy.     Allergies:   Crestor [rosuvastatin]   Social History   Socioeconomic History   Marital status: Single    Spouse name: Not on file   Number of children: Not on file   Years of education: Not on file   Highest education level: Not on file  Occupational History  Not on file  Tobacco Use   Smoking status: Never   Smokeless tobacco: Never  Vaping Use   Vaping Use: Never used  Substance and Sexual Activity   Alcohol use: Not Currently   Drug use: Never   Sexual activity: Not on file  Other Topics Concern   Not on file  Social History Narrative   Not on file   Social Determinants of Health   Financial Resource Strain: Not on file  Food Insecurity: Not on file  Transportation Needs: Not on file  Physical Activity: Not on file  Stress: Not on file  Social Connections: Not on file     Family History: The patient's family history includes Diabetes in his brother and mother. There is no history of CAD.  ROS:   Please see the history of present illness.    ROS  All other  systems reviewed and negative.   EKGs/Labs/Other Studies Reviewed:    The following studies were reviewed today: PSG, PAP titration, PAP compliance download  EKG:  EKG is not ordered today.    Recent Labs: 09/01/2021: Magnesium 2.0 09/08/2021: ALT 20; BUN 13; Creatinine, Ser 1.50; Hemoglobin 16.3; Platelets 219; Potassium 4.0; Sodium 142; TSH 2.980   Recent Lipid Panel    Component Value Date/Time   CHOL 143 09/08/2021 0821   TRIG 171 (H) 09/08/2021 0821   HDL 36 (L) 09/08/2021 0821   CHOLHDL 4.0 09/08/2021 0821   CHOLHDL 6.1 03/02/2020 0248   VLDL 13 03/02/2020 0248   LDLCALC 78 09/08/2021 0821    Physical Exam:    VS:  BP 110/78   Pulse 77   Ht 5' 10.5" (1.791 m)   Wt 274 lb 3.2 oz (124.4 kg)   SpO2 95%   BMI 38.79 kg/m     Wt Readings from Last 3 Encounters:  03/31/22 274 lb 3.2 oz (124.4 kg)  10/01/21 271 lb (122.9 kg)  09/17/21 269 lb 8 oz (122.2 kg)    GEN: Well nourished, well developed in no acute distress HEENT: Normal NECK: No JVD; No carotid bruits LYMPHATICS: No lymphadenopathy CARDIAC:RRR, no murmurs, rubs, gallops RESPIRATORY:  Clear to auscultation without rales, wheezing or rhonchi  ABDOMEN: Soft, non-tender, non-distended MUSCULOSKELETAL:  No edema; No deformity  SKIN: Warm and dry NEUROLOGIC:  Alert and oriented x 3 PSYCHIATRIC:  Normal affect  ASSESSMENT:    1. OSA (obstructive sleep apnea)   2. Primary hypertension   3. Bradycardia   4. SVT (supraventricular tachycardia)    PLAN:    In order of problems listed above:  OSA - The patient is tolerating PAP therapy well without any problems. The PAP download performed by his DME was personally reviewed and interpreted by me today and showed an AHI of 0/hr on auto BiPAP cm H2O with 0% compliance in using more than 4 hours nightly.  The patient has been using and benefiting from PAP use and will continue to benefit from therapy.  -we discussed the Inspire device but likely not an option at  his current BMI -I recommended that he start back on his auto BIPAP with IPAP max 20cm H2O, EPAP in 5cm H2O -followup with me in 3 months to see how he is doing  HTN -BP is adequately controlled on exam today -He has not require antihypertensive therapy  Bradycardia -noted during the night and likely related to OSA -Denies any dizziness or syncope -His heart rate is 77 today  SVT -s/p SVT ablation 2022 -He has had  a few episodes of palpitations for 3-4 in but would go away quickly -I have asked him to print them off of his watch and send them to me to review when they occur -continue Toprol XL 12.36m daily with PRN refills  HLD -LDL goal< 100 -I have personally reviewed and interpreted outside labs performed by patient's PCP which showed LDL 78 and HDL 36 in July 2023 -continue Praluent   Time Spent: 20 minutes total time of encounter, including 15 minutes spent in face-to-face patient care on the date of this encounter. This time includes coordination of care and counseling regarding above mentioned problem list. Remainder of non-face-to-face time involved reviewing chart documents/testing relevant to the patient encounter and documentation in the medical record. I have independently reviewed documentation from referring provider  Medication Adjustments/Labs and Tests Ordered: Current medicines are reviewed at length with the patient today.  Concerns regarding medicines are outlined above.  No orders of the defined types were placed in this encounter.  No orders of the defined types were placed in this encounter.   Signed, TFransico Him MD  03/31/2022 3:02 PM    [facility]

## 2022-03-31 NOTE — Patient Instructions (Signed)
Medication Instructions:  Your physician recommends that you continue on your current medications as directed. Please refer to the Current Medication list given to you today.  *If you need a refill on your cardiac medications before your next appointment, please call your pharmacy*   Lab Work: Please make an appointment to complete an [place] and an ALT (FASTING labs) in July, 2024.   If you have labs (blood work) drawn today and your tests are completely normal, you will receive your results only by: [place] (if you have MyChart) OR A paper copy in the mail If you have any lab test that is abnormal or we need to change your treatment, we will call you to review the results.   Testing/Procedures: None.   Follow-Up: At [facility], you and your health needs are our priority.  As part of our continuing mission to provide you with exceptional heart care, we have created designated Provider Care Teams.  These Care Teams include your primary Cardiologist (physician) and Advanced Practice Providers (APPs -  Physician Assistants and Nurse Practitioners) who all work together to provide you with the care you need, when you need it.  We recommend signing up for the patient portal called "MyChart".  Sign up information is provided on this After Visit Summary.  MyChart is used to connect with patients for Virtual Visits (Telemedicine).  Patients are able to view lab/test results, encounter notes, upcoming appointments, etc.  Non-urgent messages can be sent to your provider as well.   To learn more about what you can do with MyChart, go to [email or web address].    Your next appointment:   3 month(s)  Provider:   Fransico Him, MD

## 2022-03-31 NOTE — Addendum Note (Signed)
Addended by: Joni Reining on: 03/31/2022 03:19 PM   Modules accepted: Orders

## 2022-04-17 ENCOUNTER — Other Ambulatory Visit (HOSPITAL_COMMUNITY): Payer: Self-pay

## 2022-04-22 ENCOUNTER — Encounter (HOSPITAL_BASED_OUTPATIENT_CLINIC_OR_DEPARTMENT_OTHER): Payer: Self-pay

## 2022-05-19 ENCOUNTER — Other Ambulatory Visit (HOSPITAL_COMMUNITY): Payer: Self-pay

## 2022-05-19 ENCOUNTER — Other Ambulatory Visit: Payer: Self-pay | Admitting: Orthopedic Surgery

## 2022-05-19 MED ORDER — PREDNISONE 10 MG PO TABS
10.0000 mg | ORAL_TABLET | Freq: Every day | ORAL | 0 refills | Status: DC
  Filled 2022-05-19: qty 10, 10d supply, fill #0

## 2022-05-19 MED ORDER — AZITHROMYCIN 250 MG PO TABS
ORAL_TABLET | ORAL | 0 refills | Status: DC
  Filled 2022-05-19: qty 6, 5d supply, fill #0

## 2022-05-19 MED ORDER — AZITHROMYCIN 1 G PO PACK
1.0000 g | PACK | Freq: Once | ORAL | 0 refills | Status: DC
  Filled 2022-05-19: qty 1, 1d supply, fill #0

## 2022-05-20 ENCOUNTER — Other Ambulatory Visit: Payer: Self-pay | Admitting: Physical Medicine and Rehabilitation

## 2022-05-20 ENCOUNTER — Other Ambulatory Visit (HOSPITAL_COMMUNITY): Payer: Self-pay

## 2022-05-20 MED ORDER — ALBUTEROL SULFATE HFA 108 (90 BASE) MCG/ACT IN AERS
2.0000 | INHALATION_SPRAY | Freq: Four times a day (QID) | RESPIRATORY_TRACT | 1 refills | Status: AC | PRN
  Filled 2022-05-20: qty 6.7, 25d supply, fill #0
  Filled 2022-07-17: qty 6.7, 25d supply, fill #1

## 2022-05-21 ENCOUNTER — Other Ambulatory Visit (HOSPITAL_COMMUNITY): Payer: Self-pay

## 2022-06-24 ENCOUNTER — Ambulatory Visit: Payer: Commercial Managed Care - PPO | Attending: Cardiology | Admitting: Cardiology

## 2022-06-24 ENCOUNTER — Encounter: Payer: Self-pay | Admitting: Cardiology

## 2022-06-24 VITALS — BP 125/79 | HR 73 | Ht 70.5 in | Wt 275.0 lb

## 2022-06-24 DIAGNOSIS — I10 Essential (primary) hypertension: Secondary | ICD-10-CM | POA: Diagnosis not present

## 2022-06-24 DIAGNOSIS — G4733 Obstructive sleep apnea (adult) (pediatric): Secondary | ICD-10-CM

## 2022-06-24 DIAGNOSIS — I471 Supraventricular tachycardia, unspecified: Secondary | ICD-10-CM

## 2022-06-24 NOTE — Patient Instructions (Signed)
Medication Instructions:  Your physician recommends that you continue on your current medications as directed. Please refer to the Current Medication list given to you today.  *If you need a refill on your cardiac medications before your next appointment, please call your pharmacy*   Lab Work: None.  If you have labs (blood work) drawn today and your tests are completely normal, you will receive your results only by: MyChart Message (if you have MyChart) OR A paper copy in the mail If you have any lab test that is abnormal or we need to change your treatment, we will call you to review the results.   Testing/Procedures: None.   Follow-Up: At  [facility], you and your health needs are our priority.  As part of our continuing mission to provide you with exceptional heart care, we have created designated Provider Care Teams.  These Care Teams include your primary Cardiologist (physician) and Advanced Practice Providers (APPs -  Physician Assistants and Nurse Practitioners) who all work together to provide you with the care you need, when you need it.  We recommend signing up for the patient portal called "MyChart".  Sign up information is provided on this After Visit Summary.  MyChart is used to connect with patients for Virtual Visits (Telemedicine).  Patients are able to view lab/test results, encounter notes, upcoming appointments, etc.  Non-urgent messages can be sent to your provider as well.   To learn more about what you can do with MyChart, go to [email or web address].    Your next appointment:   1 year(s)  Provider:   Traci Turner, MD     

## 2022-06-24 NOTE — Progress Notes (Signed)
**Note Spencer-Identified via Obfuscation** Cardiology Office Note:    Date:  06/24/2022   ID:  Spencer Thomas, DOB [birth year], MRN [id number]  PCP:  Spencer Thomas, Spencer J, MD  Cardiologist:  Spencer Magic, MD    Referring MD: Spencer Thomas, Spencer J, MD   Chief Complaint  Patient presents with   Sleep Apnea    History of Present Illness:    ABDIMALIK Thomas is a 57 y.o. male with a hx of bradycardia, CKD stage II, hypertension, hyperlipidemia and PSVT.  He was referred for Hesleep study by Dr. Delton See due to nocturnal bradycardia and excessive daytime sleepiness and non restorative sleep.  He says that his colleagues thought he had narcolepsy because he was always sleeping.  He was found to have severe obstructive sleep apnea with an AHI of 41.2 with nocturnal hypoxemia with lowest O2 saturation 73%.  He underwent CPAP titration but due to ongoing events could not be treated with CPAP and was changed to auto BiPAP with IPAP max 25 cm H2O, EPAP min 5 cm H2O and pressure support 4 cm H2O.   At last office visit he had stopped using his BiPAP.  He says that he does not like the BiPAP.  He says that when he wears it he gets congested and decided not to use it.  We discussed the inspire device but he was not interested.  I encouraged him to get back on BiPAP therapy and decreased his auto BiPAP to an IPAP max of 20 cm H2O and EPAP min of 5 cm H2O.  He is now back for follow-up to see how he is doing  He is doing well with his PAP device and thinks that he has gotten used to it.  He tolerates the full face mask and feels the pressure is adequate.  Since going on PAP he feels rested in the am and has no significant daytime sleepiness.  He denies any significant mouth or nasal dryness or nasal congestion.  He does not think that he snores.    Past Medical History:  Diagnosis Date   Bradycardia    nocturnal   CKD (chronic kidney disease), stage II    HTN (hypertension)    Hyperlipemia    PSVT (paroxysmal supraventricular tachycardia)     Past  Surgical History:  Procedure Laterality Date   APPENDECTOMY     CORONARY ANGIOGRAPHY N/A 03/04/2020   Procedure: CORONARY ANGIOGRAPHY (CATH LAB);  Surgeon: Runell Gess, MD;  Location: [facility] INVAS[facility];  Service: Cardiovascular;  Laterality: N/A;   SVT ABLATION N/A 06/20/2020   Procedure: SVT ABLATION;  Surgeon: Hillis Range, MD;  Location: [facility] INVASIVE [facility];  Service: Cardiovascular;  Laterality: N/A;    Current Medications: Current Meds  Medication Sig   albuterol (VENTOLIN HFA) 108 (90 Base) MCG/ACT inhaler Inhale 2 puffs into the lungs every 6 (six) hours as needed for wheezing or shortness of breath.   Alirocumab (PRALUENT) 150 MG/ML SOAJ Inject 1 mL into the skin every 14 (fourteen) days.   cholecalciferol (VITAMIN D3) 25 MCG (1000 UNIT) tablet Take 1 tablet (1,000 Units total) by mouth daily.   ibuprofen (ADVIL) 800 MG tablet Take 1 tablet (800 mg total) by mouth every 8 (eight) hours as needed.   metoprolol succinate (TOPROL XL) 25 MG 24 hr tablet Take 1 tablet (25 mg total) by mouth daily.   Multiple Vitamin (MULTI VITAMIN DAILY PO) Take 1 tablet by mouth daily.     Allergies:   Crestor [rosuvastatin]  Social History   Socioeconomic History   Marital status: Single    Spouse name: Not on file   Number of children: Not on file   Years of education: Not on file   Highest education level: Not on file  Occupational History   Not on file  Tobacco Use   Smoking status: Never   Smokeless tobacco: Never  Vaping Use   Vaping Use: Never used  Substance and Sexual Activity   Alcohol use: Not Currently   Drug use: Never   Sexual activity: Not on file  Other Topics Concern   Not on file  Social History Narrative   Not on file   Social Determinants of Health   Financial Resource Strain: Not on file  Food Insecurity: Not on file  Transportation Needs: Not on file  Physical Activity: Not on file  Stress: Not on file  Social Connections: Not on file     Family  History: The patient's family history includes Diabetes in his brother and mother. There is no history of CAD.  ROS:   Please see the history of present illness.    ROS  All other systems reviewed and negative.   EKGs/Labs/Other Studies Reviewed:    The following studies were reviewed today: PSG, PAP titration, PAP compliance download  EKG:  EKG is not ordered today.    Recent Labs: 09/01/2021: Magnesium 2.0 09/08/2021: ALT 20; BUN 13; Creatinine, Ser 1.50; Hemoglobin 16.3; Platelets 219; Potassium 4.0; Sodium 142; TSH 2.980   Recent Lipid Panel    Component Value Date/Time   CHOL 143 09/08/2021 0821   TRIG 171 (H) 09/08/2021 0821   HDL 36 (L) 09/08/2021 0821   CHOLHDL 4.0 09/08/2021 0821   CHOLHDL 6.1 03/02/2020 0248   VLDL 13 03/02/2020 0248   LDLCALC 78 09/08/2021 0821    Physical Exam:    VS:  BP 125/79   Pulse 73   Ht 5' 10.5" (1.791 m)   Wt 275 lb (124.7 kg)   SpO2 98%   BMI 38.90 kg/m     Wt Readings from Last 3 Encounters:  06/24/22 275 lb (124.7 kg)  03/31/22 274 lb 3.2 oz (124.4 kg)  10/01/21 271 lb (122.9 kg)    GEN: Well nourished, well developed in no acute distress HEENT: Normal NECK: No JVD; No carotid bruits LYMPHATICS: No lymphadenopathy CARDIAC:RRR, no murmurs, rubs, gallops RESPIRATORY:  Clear to auscultation without rales, wheezing or rhonchi  ABDOMEN: Soft, non-tender, non-distended MUSCULOSKELETAL:  No edema; No deformity  SKIN: Warm and dry NEUROLOGIC:  Alert and oriented x 3 PSYCHIATRIC:  Normal affect  ASSESSMENT:    1. OSA (obstructive sleep apnea)   2. Primary hypertension   3. SVT (supraventricular tachycardia)    PLAN:    In order of problems listed above:  OSA - The patient is tolerating PAP therapy well without any problems. The PAP download performed by his DME was personally reviewed and interpreted by me today and showed an AHI of 1.8 /hr on auto BiPAP with IPAP max 20 cm H2O EPAP min 5 cm H2O and pressure support 4   cm H2O with 90% compliance in using more than 4 hours nightly.  The patient has been using and benefiting from PAP use and will continue to benefit from therapy.   HTN -BP is adequately controlled on exam today -He has not require antihypertensive therapy  SVT -He had 1 episode of palpitations at work the other day after walking up the stairs and  HR was irregular and he took a 12.5mg  Toprol and it resolved.  He brought in his [employer] watch EKG tracing that showed NSR in the 80's -Continue prescription drug management with Toprol-XL 25 mg daily with PRN Refills    Medication Adjustments/Labs and Tests Ordered: Current medicines are reviewed at length with the patient today.  Concerns regarding medicines are outlined above.  No orders of the defined types were placed in this encounter.  No orders of the defined types were placed in this encounter.   Signed, Spencer Magic, MD  06/24/2022 2:54 PM    [facility]

## 2022-07-17 ENCOUNTER — Other Ambulatory Visit: Payer: Self-pay

## 2022-07-17 ENCOUNTER — Other Ambulatory Visit (HOSPITAL_COMMUNITY): Payer: Self-pay

## 2022-07-20 ENCOUNTER — Other Ambulatory Visit: Payer: Self-pay

## 2022-07-20 ENCOUNTER — Other Ambulatory Visit (HOSPITAL_COMMUNITY): Payer: Self-pay

## 2022-07-28 ENCOUNTER — Other Ambulatory Visit (HOSPITAL_COMMUNITY): Payer: Self-pay

## 2022-07-28 ENCOUNTER — Ambulatory Visit: Payer: Commercial Managed Care - PPO | Admitting: Sports Medicine

## 2022-07-28 VITALS — BP 136/84 | HR 68

## 2022-07-28 DIAGNOSIS — R0602 Shortness of breath: Secondary | ICD-10-CM

## 2022-07-28 DIAGNOSIS — R062 Wheezing: Secondary | ICD-10-CM | POA: Diagnosis not present

## 2022-07-28 MED ORDER — PREDNISONE 20 MG PO TABS
40.0000 mg | ORAL_TABLET | Freq: Every day | ORAL | 0 refills | Status: AC
Start: 2022-07-28 — End: 2022-08-03
  Filled 2022-07-28: qty 10, 5d supply, fill #0

## 2022-07-28 NOTE — Progress Notes (Unsigned)
Spencer Thomas - 57 y.o. male MRN [id number]  Date of birth: [birth year]  Office Visit Note: Visit Date: 07/28/2022 PCP: [place], Raymond J, MD Referred by: de [place], Raymond J, MD  Subjective: No chief complaint on file.  HPI: Spencer Thomas is a pleasant 57 y.o. male who presents today for ***  Pertinent ROS were reviewed with the patient and found to be negative unless otherwise specified above in HPI.   Assessment & Plan: Visit Diagnoses:  1. Wheezing     Plan: ***  Follow-up: No follow-ups on file.   Meds & Orders:  Meds ordered this encounter  Medications   predniSONE (DELTASONE) 20 MG tablet    Sig: Take 2 tablets (40 mg total) by mouth daily with breakfast for 5 days.    Dispense:  10 tablet    Refill:  0   No orders of the defined types were placed in this encounter.    Procedures: No procedures performed      Clinical History: No specialty comments available.  He reports that he has never smoked. He has never used smokeless tobacco.  Recent Labs    09/08/21 0821  HGBA1C 5.5    Objective:   Vital Signs: There were no vitals taken for this visit.  Physical Exam  Gen: Well-appearing, in no acute distress; non-toxic CV: Regular Rate. Well-perfused. Warm.  Resp: Breathing unlabored on room air; no wheezing. Psych: Fluid speech in conversation; appropriate affect; normal thought process Neuro: Sensation intact throughout. No gross coordination deficits.   Imaging:  Narrative & Impression  CLINICAL DATA:  Racing heart   EXAM: CHEST - 2 VIEW   COMPARISON:  Radiographs 03/01/2020   FINDINGS: The heart size and mediastinal contours are within normal limits. Both lungs are clear. No pleural effusion or pneumothorax. The visualized skeletal structures are unremarkable.   IMPRESSION: No active cardiopulmonary disease.     Electronically Signed   By: Minerva Fester M.D.   On: 09/01/2021 14:42    Past Medical/Family/Surgical/Social  History: Medications & Allergies reviewed per EMR, new medications updated. Patient Active Problem List   Diagnosis Date Noted   Wellness examination 09/17/2021   Mass of testicle 08/06/2021   Obstructive sleep apnea 08/06/2021   Adverse reaction to statin medication 09/13/2020   Sinus pause 06/26/2020   Snoring 06/26/2020   Angina at rest    SVT (supraventricular tachycardia)    Chest pain 03/01/2020   Hyperlipidemia 12/20/2019   Cardiac arrhythmia 12/20/2019   Palpitations 12/20/2019   Irregular heart rate 11/15/2019   Severe obesity (BMI 35.0-39.9) with comorbidity (HCC) 10/06/2018   Past Medical History:  Diagnosis Date   Bradycardia    nocturnal   CKD (chronic kidney disease), stage II    HTN (hypertension)    Hyperlipemia    PSVT (paroxysmal supraventricular tachycardia)    Family History  Problem Relation Age of Onset   Diabetes Mother    Diabetes Brother    CAD Neg Hx        neg for premature CAD   Past Surgical History:  Procedure Laterality Date   APPENDECTOMY     CORONARY ANGIOGRAPHY N/A 03/04/2020   Procedure: CORONARY ANGIOGRAPHY (CATH LAB);  Surgeon: Runell Gess, MD;  Location: [facility] INVASIVE CV LAB;  Service: Cardiovascular;  Laterality: N/A;   SVT ABLATION N/A 06/20/2020   Procedure: SVT ABLATION;  Surgeon: Hillis Range, MD;  Location: [facility] INVASIVE CV LAB;  Service: Cardiovascular;  Laterality: N/A;   Social  History   Occupational History   Not on file  Tobacco Use   Smoking status: Never   Smokeless tobacco: Never  Vaping Use   Vaping Use: Never used  Substance and Sexual Activity   Alcohol use: Not Currently   Drug use: Never   Sexual activity: Not on file

## 2022-07-29 ENCOUNTER — Other Ambulatory Visit (HOSPITAL_COMMUNITY): Payer: Self-pay

## 2022-07-29 ENCOUNTER — Encounter: Payer: Self-pay | Admitting: Sports Medicine

## 2022-08-21 ENCOUNTER — Telehealth (HOSPITAL_BASED_OUTPATIENT_CLINIC_OR_DEPARTMENT_OTHER): Payer: Self-pay | Admitting: *Deleted

## 2022-08-21 ENCOUNTER — Encounter (HOSPITAL_BASED_OUTPATIENT_CLINIC_OR_DEPARTMENT_OTHER): Payer: Self-pay | Admitting: *Deleted

## 2022-08-21 NOTE — Telephone Encounter (Signed)
LVM to offer colon cancer screening. Mychart message sent

## 2022-08-24 ENCOUNTER — Ambulatory Visit: Payer: Commercial Managed Care - PPO | Attending: Cardiology

## 2022-08-24 DIAGNOSIS — E785 Hyperlipidemia, unspecified: Secondary | ICD-10-CM | POA: Diagnosis not present

## 2022-08-25 LAB — LIPID PANEL
Chol/HDL Ratio: 3.6 ratio (ref 0.0–5.0)
Cholesterol, Total: 152 mg/dL (ref 100–199)
HDL: 42 mg/dL (ref >39)
LDL Chol Calc (NIH): 94 mg/dL (ref 0–99)
Triglycerides: 86 mg/dL (ref 0–149)
VLDL Cholesterol Cal: 16 mg/dL (ref 5–40)

## 2022-08-25 LAB — ALT: ALT: 39 IU/L (ref 0–44)

## 2022-09-23 ENCOUNTER — Ambulatory Visit (INDEPENDENT_AMBULATORY_CARE_PROVIDER_SITE_OTHER): Payer: Commercial Managed Care - PPO | Admitting: Family Medicine

## 2022-09-23 ENCOUNTER — Encounter (HOSPITAL_BASED_OUTPATIENT_CLINIC_OR_DEPARTMENT_OTHER): Payer: Self-pay | Admitting: Family Medicine

## 2022-09-23 VITALS — BP 112/75 | HR 60 | Ht 70.5 in | Wt 262.0 lb

## 2022-09-23 DIAGNOSIS — Z125 Encounter for screening for malignant neoplasm of prostate: Secondary | ICD-10-CM | POA: Diagnosis not present

## 2022-09-23 DIAGNOSIS — N5089 Other specified disorders of the male genital organs: Secondary | ICD-10-CM | POA: Diagnosis not present

## 2022-09-23 DIAGNOSIS — Z1159 Encounter for screening for other viral diseases: Secondary | ICD-10-CM

## 2022-09-23 DIAGNOSIS — Z0000 Encounter for general adult medical examination without abnormal findings: Secondary | ICD-10-CM

## 2022-09-23 NOTE — Progress Notes (Signed)
Subjective:    CC: Annual Physical Exam  HPI:  Spencer Thomas is a 57 y.o. presenting for annual physical  I reviewed the past medical history, family history, social history, surgical history, and allergies today and no changes were needed.  Please see the problem list section below in epic for further details.  Past Medical History: Past Medical History:  Diagnosis Date   Bradycardia    nocturnal   CKD (chronic kidney disease), stage II    HTN (hypertension)    Hyperlipemia    PSVT (paroxysmal supraventricular tachycardia)    Past Surgical History: Past Surgical History:  Procedure Laterality Date   APPENDECTOMY     CORONARY ANGIOGRAPHY N/A 03/04/2020   Procedure: CORONARY ANGIOGRAPHY (CATH LAB);  Surgeon: Runell Gess, MD;  Location: [facility] INVASIVE CV LAB;  Service: Cardiovascular;  Laterality: N/A;   SVT ABLATION N/A 06/20/2020   Procedure: SVT ABLATION;  Surgeon: Hillis Range, MD;  Location: [facility] INVASIVE CV LAB;  Service: Cardiovascular;  Laterality: N/A;   Social History: Social History   Socioeconomic History   Marital status: Single    Spouse name: Not on file   Number of children: Not on file   Years of education: Not on file   Highest education level: Not on file  Occupational History   Not on file  Tobacco Use   Smoking status: Never   Smokeless tobacco: Never  Vaping Use   Vaping status: Never Used  Substance and Sexual Activity   Alcohol use: Not Currently   Drug use: Never   Sexual activity: Not on file  Other Topics Concern   Not on file  Social History Narrative   Not on file   Social Determinants of Health   Financial Resource Strain: High Risk (10/06/2018)   Received from [facility]   Overall Financial Resource Strain (CARDIA)    Difficulty of Paying Living Expenses: Hard  Food Insecurity: No Food Insecurity (10/06/2018)   Received from [facility]   Hunger Vital Sign    Worried About Running Out of Food in the Last Year: Never true     Ran Out of Food in the Last Year: Never true  Transportation Needs: No Transportation Needs (10/06/2018)   Received from [facility] - Transportation    Lack of Transportation (Medical): No    Lack of Transportation (Non-Medical): No  Physical Activity: Insufficiently Active (10/06/2018)   Received from [facility]   Exercise Vital Sign    Days of Exercise per Week: 4 days    Minutes of Exercise per Session: 30 min  Stress: Stress Concern Present (10/06/2018)   Received from [facility] - Occupational Stress Questionnaire    Feeling of Stress : Very much  Social Connections: Unknown (06/19/2021)   Received from [facility]   Social Network    Social Network: Not on file   Family History: Family History  Problem Relation Age of Onset   Diabetes Mother    Diabetes Brother    CAD Neg Hx        neg for premature CAD   Allergies: Allergies  Allergen Reactions   Crestor [Rosuvastatin] Nausea And Vomiting    Myalgia also   Medications: See med rec.  Review of Systems: No headache, visual changes, nausea, vomiting, diarrhea, constipation, dizziness, abdominal pain, skin rash, fevers, chills, night sweats, swollen lymph nodes, weight loss, chest pain, body aches, joint swelling, muscle aches, shortness of  breath, mood changes, visual or auditory hallucinations.  Objective:    BP 112/75   Pulse 60   Ht 5' 10.5" (1.791 m)   Wt 262 lb (118.8 kg)   SpO2 98%   BMI 37.06 kg/m   General: Well Developed, well nourished, and in no acute distress.  Neuro: Alert and oriented x3, extra-ocular muscles intact, sensation grossly intact. Cranial nerves II through XII are intact, motor, sensory, and coordinative functions are all intact. HEENT: Normocephalic, atraumatic, pupils equal round reactive to light, neck supple, no masses, no lymphadenopathy, thyroid nonpalpable. Oropharynx, nasopharynx, external ear canals are  unremarkable. Skin: Warm and dry, no rashes noted.  Cardiac: Regular rate and rhythm, no murmurs rubs or gallops.  Respiratory: Clear to auscultation bilaterally. Not using accessory muscles, speaking in full sentences.  Abdominal: Soft, nontender, nondistended, positive bowel sounds, no masses, no organomegaly.  Musculoskeletal: Shoulder, elbow, wrist, hip, knee, ankle stable, and with full range of motion.  Impression and Recommendations:    Wellness examination Assessment & Plan: Routine HCM labs reviewed. HCM reviewed/discussed. Anticipatory guidance regarding healthy weight, lifestyle and choices given. Recommend healthy diet.  Recommend approximately 150 minutes/week of moderate intensity exercise Recommend regular dental and vision exams Always use seatbelt/lap and shoulder restraints Recommend using smoke alarms and checking batteries at least twice a year Recommend using sunscreen when outside Discussed colon cancer screening recommendations, options.  Patient reports having colonoscopy completed through [facility] about 5 years ago.  Will request records from [facility] to review for next recommended colonoscopy Discussed recommendations for shingles vaccine - UTD Discussed tetanus immunization recommendations, patient is UTD  Orders: -     CBC with Differential/Platelet -     Comprehensive metabolic panel -     Hemoglobin A1c -     Hepatitis C antibody -     Lipid panel -     PSA Total (Reflex To Free) -     TSH Rfx on Abnormal to Free T4  Prostate cancer screening -     PSA Total (Reflex To Free)  Need for hepatitis C screening test -     Hepatitis C antibody  Mass of testicle Assessment & Plan: Ultrasound completed previously with cystic structures noted, suggestion of mild varicocele.  We again reviewed findings today.  Patient is having some intermittent discomfort, primarily noted when engaging in increased physical activity.  Symptoms are generally mild.  Again reviewed  consideration for referral to urologist for further evaluation.  Patient elects to hold off for now.  Advised that if he would like referral to urologist, he may reach back out to our office at any time   Return in about 1 year (around 09/23/2023) for CPE.   ___________________________________________  [place], MD, ABFM, CAQSM Primary Care and Sports Medicine [facility]

## 2022-09-23 NOTE — Assessment & Plan Note (Signed)
Routine HCM labs reviewed. HCM reviewed/discussed. Anticipatory guidance regarding healthy weight, lifestyle and choices given. Recommend healthy diet.  Recommend approximately 150 minutes/week of moderate intensity exercise Recommend regular dental and vision exams Always use seatbelt/lap and shoulder restraints Recommend using smoke alarms and checking batteries at least twice a year Recommend using sunscreen when outside Discussed colon cancer screening recommendations, options.  Patient reports having colonoscopy completed through [facility] about 5 years ago.  Will request records from [facility] to review for next recommended colonoscopy Discussed recommendations for shingles vaccine - UTD Discussed tetanus immunization recommendations, patient is UTD

## 2022-09-23 NOTE — Assessment & Plan Note (Signed)
Ultrasound completed previously with cystic structures noted, suggestion of mild varicocele.  We again reviewed findings today.  Patient is having some intermittent discomfort, primarily noted when engaging in increased physical activity.  Symptoms are generally mild.  Again reviewed consideration for referral to urologist for further evaluation.  Patient elects to hold off for now.  Advised that if he would like referral to urologist, he may reach back out to our office at any time

## 2022-09-24 LAB — CBC WITH DIFFERENTIAL/PLATELET
Basophils Absolute: 0.1 10*3/uL (ref 0.0–0.2)
Basos: 2 %
EOS (ABSOLUTE): 0.1 10*3/uL (ref 0.0–0.4)
Eos: 3 %
Hematocrit: 45.1 % (ref 37.5–51.0)
Hemoglobin: 15.1 g/dL (ref 13.0–17.7)
Immature Grans (Abs): 0 10*3/uL (ref 0.0–0.1)
Immature Granulocytes: 0 %
Lymphocytes Absolute: 1.2 10*3/uL (ref 0.7–3.1)
Lymphs: 29 %
MCH: 28.8 pg (ref 26.6–33.0)
MCHC: 33.5 g/dL (ref 31.5–35.7)
MCV: 86 fL (ref 79–97)
Monocytes Absolute: 0.5 10*3/uL (ref 0.1–0.9)
Monocytes: 12 %
NRBC: 1 % — ABNORMAL HIGH (ref 0–0)
Neutrophils Absolute: 2.2 10*3/uL (ref 1.4–7.0)
Neutrophils: 54 %
Platelets: 180 10*3/uL (ref 150–450)
RBC: 5.24 x10E6/uL (ref 4.14–5.80)
RDW: 12.7 % (ref 11.6–15.4)
WBC: 4.1 10*3/uL (ref 3.4–10.8)

## 2022-09-24 LAB — COMPREHENSIVE METABOLIC PANEL
ALT: 23 IU/L (ref 0–44)
AST: 24 IU/L (ref 0–40)
Albumin: 4.3 g/dL (ref 3.8–4.9)
Alkaline Phosphatase: 56 IU/L (ref 44–121)
BUN/Creatinine Ratio: 7 — ABNORMAL LOW (ref 9–20)
BUN: 10 mg/dL (ref 6–24)
Bilirubin Total: 0.6 mg/dL (ref 0.0–1.2)
CO2: 23 mmol/L (ref 20–29)
Calcium: 9.7 mg/dL (ref 8.7–10.2)
Chloride: 104 mmol/L (ref 96–106)
Creatinine, Ser: 1.39 mg/dL — ABNORMAL HIGH (ref 0.76–1.27)
Globulin, Total: 2.3 g/dL (ref 1.5–4.5)
Glucose: 109 mg/dL — ABNORMAL HIGH (ref 70–99)
Potassium: 4 mmol/L (ref 3.5–5.2)
Sodium: 141 mmol/L (ref 134–144)
Total Protein: 6.6 g/dL (ref 6.0–8.5)
eGFR: 60 mL/min/{1.73_m2} (ref >59)

## 2022-09-24 LAB — LIPID PANEL
Chol/HDL Ratio: 3.5 ratio (ref 0.0–5.0)
Cholesterol, Total: 149 mg/dL (ref 100–199)
HDL: 43 mg/dL (ref >39)
LDL Chol Calc (NIH): 91 mg/dL (ref 0–99)
Triglycerides: 76 mg/dL (ref 0–149)
VLDL Cholesterol Cal: 15 mg/dL (ref 5–40)

## 2022-09-24 LAB — TSH RFX ON ABNORMAL TO FREE T4: TSH: 2.58 u[IU]/mL (ref 0.450–4.500)

## 2022-09-24 LAB — HEMOGLOBIN A1C
Est. average glucose Bld gHb Est-mCnc: 105 mg/dL
Hgb A1c MFr Bld: 5.3 % (ref 4.8–5.6)

## 2022-09-24 LAB — HEPATITIS C ANTIBODY: Hep C Virus Ab: NONREACTIVE

## 2022-09-24 LAB — PSA TOTAL (REFLEX TO FREE): Prostate Specific Ag, Serum: 0.9 ng/mL (ref 0.0–4.0)

## 2022-10-21 ENCOUNTER — Other Ambulatory Visit: Payer: Self-pay | Admitting: Physical Medicine and Rehabilitation

## 2022-10-21 MED ORDER — IBUPROFEN 800 MG PO TABS: 800.0000 mg | ORAL_TABLET | Freq: Three times a day (TID) | ORAL | 3 refills | Status: AC | PRN

## 2022-12-22 ENCOUNTER — Other Ambulatory Visit: Payer: Self-pay

## 2022-12-22 ENCOUNTER — Other Ambulatory Visit (HOSPITAL_COMMUNITY): Payer: Self-pay

## 2022-12-30 ENCOUNTER — Encounter: Payer: Self-pay | Admitting: Sports Medicine

## 2022-12-30 ENCOUNTER — Ambulatory Visit (INDEPENDENT_AMBULATORY_CARE_PROVIDER_SITE_OTHER): Payer: Commercial Managed Care - PPO | Admitting: Sports Medicine

## 2022-12-30 DIAGNOSIS — Z23 Encounter for immunization: Secondary | ICD-10-CM

## 2022-12-30 NOTE — Progress Notes (Signed)
Patient seen as he is due for his Td booster.  - This was administered into the right deltoid today after discussion on R/B/I - may use Ice, OTC-anti-inflammatories as needed for pain control - Repeat in 10 years  Madelyn Brunner, DO Primary Care Sports Medicine Physician  [facility] - Orthopedics  This note was dictated using Dragon naturally speaking software and may contain errors in syntax, spelling, or content which have not been identified prior to signing this note.

## 2023-01-01 ENCOUNTER — Other Ambulatory Visit (HOSPITAL_COMMUNITY): Payer: Self-pay

## 2023-01-04 ENCOUNTER — Other Ambulatory Visit (HOSPITAL_COMMUNITY): Payer: Self-pay

## 2023-02-18 ENCOUNTER — Ambulatory Visit
Admission: RE | Admit: 2023-02-18 | Discharge: 2023-02-18 | Disposition: A | Discharge status: Home / Self Care | Payer: Commercial Managed Care - PPO | Source: Ambulatory Visit | Attending: Internal Medicine

## 2023-02-18 VITALS — BP 136/76 | HR 75 | Temp 97.9°F | Resp 18 | Ht 70.5 in | Wt 265.0 lb

## 2023-02-18 DIAGNOSIS — H10022 Other mucopurulent conjunctivitis, left eye: Secondary | ICD-10-CM | POA: Diagnosis not present

## 2023-02-18 MED ORDER — ERYTHROMYCIN 5 MG/GM OP OINT: TOPICAL_OINTMENT | OPHTHALMIC | 0 refills | Status: DC

## 2023-02-18 NOTE — ED Triage Notes (Addendum)
 Pt presents with left eye drainage and eye irritation since wakening this AM @ 0200. No known injuries to the eye. Pt denies taking/applying medications to the eye. Pt states my eye was closed shut when I woke up during the night. No pain, just discomfort. Pt does endorse a little blurred vision out of the left eye, just feels super irritated.

## 2023-02-18 NOTE — ED Provider Notes (Signed)
 [facility]    CSN: [id number] Arrival date & time: 02/18/23  0950      History   Chief Complaint Chief Complaint  Patient presents with   Eye Drainage    HPI Spencer Thomas is a 58 y.o. male.   Spencer Thomas is a 58 y.o. male presenting for chief complaint of left eye redness, drainage, and irritation that started this morning at approximately 2 AM.  He woke up and his left eye was crusted shut with white/yellow drainage.  Denies recent trauma or injuries to the eye, vision changes, contact lens use, dizziness, viral URI symptoms, fevers, neck pain, and watery/itchy eyes.  He does not recall being in contact with anyone with conjunctivitis.  He has not attempted use of any over-the-counter medications to help with symptoms PTA.  Right eye is unaffected.     Past Medical History:  Diagnosis Date   Bradycardia    nocturnal   CKD (chronic kidney disease), stage II    HTN (hypertension)    Hyperlipemia    PSVT (paroxysmal supraventricular tachycardia) (HCC)     Patient Active Problem List   Diagnosis Date Noted   Wellness examination 09/17/2021   Mass of testicle 08/06/2021   Obstructive sleep apnea 08/06/2021   Adverse reaction to statin medication 09/13/2020   Sinus pause 06/26/2020   Snoring 06/26/2020   Angina at rest [facility])    SVT (supraventricular tachycardia) (HCC)    Chest pain 03/01/2020   Hyperlipidemia 12/20/2019   Cardiac arrhythmia 12/20/2019   Palpitations 12/20/2019   Irregular heart rate 11/15/2019   Severe obesity (BMI 35.0-39.9) with comorbidity (HCC) 10/06/2018    Past Surgical History:  Procedure Laterality Date   APPENDECTOMY     CORONARY ANGIOGRAPHY N/A 03/04/2020   Procedure: CORONARY ANGIOGRAPHY (CATH LAB);  Surgeon: Court Dorn PARAS, MD;  Location: [facility] INVASIVE CV LAB;  Service: Cardiovascular;  Laterality: N/A;   SVT ABLATION N/A 06/20/2020   Procedure: SVT ABLATION;  Surgeon: Kelsie Agent, MD;  Location: [facility] INVASIVE CV LAB;   Service: Cardiovascular;  Laterality: N/A;       Home Medications    Prior to Admission medications   Medication Sig Start Date End Date Taking? Authorizing Provider  erythromycin  ophthalmic ointment Place a 1/2 inch ribbon of ointment into the lower eyelid every 12 hours for the next 7 days. 02/18/23  Yes Enedelia Dorna HERO, FNP  albuterol  (VENTOLIN  HFA) 108 (90 Base) MCG/ACT inhaler Inhale 2 puffs into the lungs every 6 (six) hours as needed for wheezing or shortness of breath. 05/20/22   Williams, Megan E, NP  Alirocumab  (PRALUENT ) 150 MG/ML SOAJ Inject 1 mL into the skin every 14 (fourteen) days. 03/31/22   Shlomo Wilbert SAUNDERS, MD  cetirizine  (ZYRTEC  ALLERGY) 10 MG tablet Take 1 tablet (10 mg total) by mouth at bedtime. 04/01/21 03/31/22  Joesph Shaver Scales, PA-C  cholecalciferol  (VITAMIN D3) 25 MCG (1000 UNIT) tablet Take 1 tablet (1,000 Units total) by mouth daily. 11/13/21   de [place], Raymond J, MD  ibuprofen  (ADVIL ) 800 MG tablet Take 1 tablet (800 mg total) by mouth every 8 (eight) hours as needed. 10/21/22   Williams, Megan E, NP  metoprolol  succinate (TOPROL  XL) 25 MG 24 hr tablet Take 1 tablet (25 mg total) by mouth daily. 03/31/22   Shlomo Wilbert SAUNDERS, MD  Multiple Vitamin (MULTI VITAMIN DAILY PO) Take 1 tablet by mouth daily.    [provider]    Family History Family History  Problem Relation Age of Onset   Diabetes Mother    Diabetes Brother    CAD Neg Hx        neg for premature CAD    Social History Social History   Tobacco Use   Smoking status: Never   Smokeless tobacco: Never  Vaping Use   Vaping status: Never Used  Substance Use Topics   Alcohol use: Not Currently   Drug use: Never     Allergies   Crestor  [rosuvastatin ]   Review of Systems Review of Systems Per HPI  Physical Exam Triage Vital Signs ED Triage Vitals  Encounter Vitals Group     BP 02/18/23 1002 136/76     Systolic BP Percentile --      Diastolic BP Percentile --      Pulse  Rate 02/18/23 1002 75     Resp 02/18/23 1002 18     Temp 02/18/23 1002 97.9 F (36.6 C)     Temp Source 02/18/23 1002 Oral     SpO2 02/18/23 1002 99 %     Weight 02/18/23 1001 265 lb (120.2 kg)     Height 02/18/23 1001 5' 10.5 (1.791 m)     Head Circumference --      Peak Flow --      Pain Score 02/18/23 1001 0     Pain Loc --      Pain Education --      Exclude from Growth Chart --    No data found.  Updated Vital Signs BP 136/76 (BP Location: Right Arm)   Pulse 75   Temp 97.9 F (36.6 C) (Oral)   Resp 18   Ht 5' 10.5 (1.791 m)   Wt 265 lb (120.2 kg)   SpO2 99%   BMI 37.49 kg/m   Visual Acuity Right Eye Distance:   Left Eye Distance:   Bilateral Distance:    Right Eye Near:   Left Eye Near:    Bilateral Near:     Physical Exam Vitals and nursing note reviewed.  Constitutional:      Appearance: He is not ill-appearing or toxic-appearing.  HENT:     Head: Normocephalic and atraumatic.     Right Ear: Hearing, tympanic membrane, ear canal and external ear normal.     Left Ear: Hearing, tympanic membrane, ear canal and external ear normal.     Nose: Nose normal.     Mouth/Throat:     Lips: Pink.     Mouth: Mucous membranes are moist. No injury or oral lesions.     Dentition: Normal dentition.     Tongue: No lesions.     Pharynx: Oropharynx is clear. Uvula midline. No pharyngeal swelling, oropharyngeal exudate, posterior oropharyngeal erythema, uvula swelling or postnasal drip.     Tonsils: No tonsillar exudate.  Eyes:     General: Lids are normal. Vision grossly intact. Gaze aligned appropriately. No visual field deficit.       Left eye: Discharge (Purulent discharge from left eye) present.    Extraocular Movements: Extraocular movements intact.     Conjunctiva/sclera:     Left eye: Left conjunctiva is injected. No chemosis, exudate or hemorrhage.    Comments: EOMs intact without pain or dizziness elicited.  No erythema, swelling, or warmth to the periorbital  region of the left eye.  Right eye is normal.  Neck:     Trachea: Trachea and phonation normal.  Pulmonary:     Effort: Pulmonary effort is normal.  Musculoskeletal:  Cervical back: Neck supple.  Lymphadenopathy:     Cervical: No cervical adenopathy.  Skin:    General: Skin is warm and dry.     Capillary Refill: Capillary refill takes less than 2 seconds.     Findings: No rash.  Neurological:     General: No focal deficit present.     Mental Status: He is alert and oriented to person, place, and time. Mental status is at baseline.     Cranial Nerves: No dysarthria or facial asymmetry.  Psychiatric:        Mood and Affect: Mood normal.        Speech: Speech normal.        Behavior: Behavior normal.        Thought Content: Thought content normal.        Judgment: Judgment normal.      UC Treatments / Results  Labs (all labs ordered are listed, but only abnormal results are displayed) Labs Reviewed - No data to display  EKG   Radiology No results found.  Procedures Procedures (including critical care time)  Medications Ordered in UC Medications - No data to display  Initial Impression / Assessment and Plan / UC Course  I have reviewed the triage vital signs and the nursing notes.  Pertinent labs & imaging results that were available during my care of the patient were reviewed by me and considered in my medical decision making (see chart for details).   1.  Mucopurulent conjunctivitis of left eye Presentation consistent with acute bacterial conjunctivitis.  HEENT exam stable, low suspicion for ocular emergency.  Ophthalmic medication as prescribed for the next 7 days.  Warm compress recommended frequently.  Over the counter medications as needed for aches/pains. Hand hygiene discussed to prevent spread of infection to others.  Advised to change pillowcase after 2 to 3 days of antibiotics to avoid reinfection.   Counseled patient on potential for adverse  effects with medications prescribed/recommended today, strict ER and return-to-clinic precautions discussed, patient verbalized understanding.    Final Clinical Impressions(s) / UC Diagnoses   Final diagnoses:  Mucopurulent conjunctivitis of left eye     Discharge Instructions      You have bacterial conjunctivitis (pink eye) which is an eye infection.    - Use antibiotic eye medication sent to pharmacy as directed.  - Change your pillowcase after 2 to 3 days to avoid reinfection.  - You may take Tylenol  every 6 hours as needed for any pain you may have.  - Avoid scratching your eye.  Wash your hands frequently to avoid spread of infection to others.  Perform warm compresses to your eye before applying the eye medication.  If you develop any new or worsening symptoms or do not improve in the next 2 to 3 days, please return.  If your symptoms are severe, please go to the emergency room.  Follow-up with your primary care provider for further evaluation and management of your symptoms as well as ongoing wellness visits.  I hope you feel better!    ED Prescriptions     Medication Sig Dispense Auth. Provider   erythromycin  ophthalmic ointment Place a 1/2 inch ribbon of ointment into the lower eyelid every 12 hours for the next 7 days. 3.5 g Enedelia Dorna HERO, FNP      PDMP not reviewed this encounter.   Enedelia Dorna HERO, [place] 02/18/23 1018

## 2023-02-18 NOTE — Discharge Instructions (Signed)
You have bacterial conjunctivitis (pink eye) which is an eye infection.    - Use antibiotic eye medication sent to pharmacy as directed.  - Change your pillowcase after 2 to 3 days to avoid reinfection.  - You may take Tylenol every 6 hours as needed for any pain you may have.  - Avoid scratching your eye.  Wash your hands frequently to avoid spread of infection to others.  Perform warm compresses to your eye before applying the eye medication.  If you develop any new or worsening symptoms or do not improve in the next 2 to 3 days, please return.  If your symptoms are severe, please go to the emergency room.  Follow-up with your primary care provider for further evaluation and management of your symptoms as well as ongoing wellness visits.  I hope you feel better!  

## 2023-02-21 ENCOUNTER — Ambulatory Visit
Admission: RE | Admit: 2023-02-21 | Discharge: 2023-02-21 | Disposition: A | Discharge status: Home / Self Care | Payer: Commercial Managed Care - PPO | Source: Ambulatory Visit | Attending: Family Medicine | Admitting: Family Medicine

## 2023-02-21 VITALS — BP 118/77 | HR 75 | Temp 97.7°F | Resp 18

## 2023-02-21 DIAGNOSIS — H109 Unspecified conjunctivitis: Secondary | ICD-10-CM

## 2023-02-21 MED ORDER — MOXIFLOXACIN HCL 0.5 % OP SOLN: 1.0000 [drp] | Freq: Three times a day (TID) | OPHTHALMIC | 0 refills | Status: DC

## 2023-02-21 NOTE — ED Triage Notes (Addendum)
 Pt presents with right eye pain and drainage that began yesterday 1/11. Pt was seen here on 1/9 for left eye pain and drainage. Was told to return if symptoms worsened or spread to other eye. Pt currently rates his right eye pain a 3/10. Pt reports he has been applying the prescribed erythromycin  gel to bilateral eyes with no improvement.

## 2023-02-21 NOTE — ED Provider Notes (Signed)
 [facility]    CSN: [id number] Arrival date & time: 02/21/23  9074      History   Chief Complaint Chief Complaint  Patient presents with   Eye Pain   Eye Drainage    HPI EDWYN INCLAN is a 58 y.o. male.   The history is provided by the patient.  Eye Pain Pertinent negatives include no headaches.  Bilateral eye redness, discomfort described as grittiness, drainage, matting and crusting.  Started in right eye several days seen here treated with erythromycin  ointment now having symptoms in both eyes.  Admits slight scratchy throat.  Denies known injury or exposures.  Wears glasses but no contact lenses.  No history of prior eye surgery. 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   Past Medical History:  Diagnosis Date   Bradycardia    nocturnal   CKD (chronic kidney disease), stage II    HTN (hypertension)    Hyperlipemia    PSVT (paroxysmal supraventricular tachycardia) (HCC)     Patient Active Problem List   Diagnosis Date Noted   Wellness examination 09/17/2021   Mass of testicle 08/06/2021   Obstructive sleep apnea 08/06/2021   Adverse reaction to statin medication 09/13/2020   Sinus pause 06/26/2020   Snoring 06/26/2020   Angina at rest [facility])    SVT (supraventricular tachycardia) (HCC)    Chest pain 03/01/2020   Hyperlipidemia 12/20/2019   Cardiac arrhythmia 12/20/2019   Palpitations 12/20/2019   Irregular heart rate 11/15/2019   Severe obesity (BMI 35.0-39.9) with comorbidity (HCC) 10/06/2018    Past Surgical History:  Procedure Laterality Date   APPENDECTOMY     CORONARY ANGIOGRAPHY N/A 03/04/2020   Procedure: CORONARY ANGIOGRAPHY (CATH LAB);  Surgeon: Court Dorn PARAS, MD;  Location: [facility] INVASIVE CV LAB;  Service: Cardiovascular;  Laterality: N/A;   SVT ABLATION N/A 06/20/2020   Procedure: SVT ABLATION;  Surgeon: Kelsie Agent, MD;  Location: [facility] INVASIVE CV LAB;  Service: Cardiovascular;  Laterality: N/A;        Home Medications    Prior to Admission medications   Medication Sig Start Date End Date Taking? Authorizing Provider  moxifloxacin  (VIGAMOX ) 0.5 % ophthalmic solution Place 1 drop into both eyes 3 (three) times daily for 7 days. 02/21/23 02/28/23 Yes Avory Rahimi, PA  albuterol  (VENTOLIN  HFA) 108 (90 Base) MCG/ACT inhaler Inhale 2 puffs into the lungs every 6 (six) hours as needed for wheezing or shortness of breath. 05/20/22   Williams, Megan E, NP  Alirocumab  (PRALUENT ) 150 MG/ML SOAJ Inject 1 mL into the skin every 14 (fourteen) days. 03/31/22   Shlomo Wilbert SAUNDERS, MD  cetirizine  (ZYRTEC  ALLERGY) 10 MG tablet Take 1 tablet (10 mg total) by mouth at bedtime. 04/01/21 03/31/22  Joesph Shaver Scales, PA-C  cholecalciferol  (VITAMIN D3) 25 MCG (1000 UNIT) tablet Take 1 tablet (1,000 Units total) by mouth daily. 11/13/21   de [place], Raymond J, MD  erythromycin  ophthalmic ointment Place a 1/2 inch ribbon of ointment into the lower eyelid every 12 hours for the next 7 days. 02/18/23   Enedelia Dorna HERO, FNP  ibuprofen  (ADVIL ) 800 MG tablet Take 1 tablet (800 mg total) by mouth every 8 (eight) hours as needed. 10/21/22   Williams, Megan E, NP  metoprolol  succinate (TOPROL  XL) 25 MG 24 hr tablet Take 1 tablet (25 mg total) by mouth daily. 03/31/22   Shlomo Wilbert SAUNDERS, MD  Multiple Vitamin (MULTI VITAMIN DAILY PO) Take 1 tablet by mouth  daily.    [provider]    Family History Family History  Problem Relation Age of Onset   Diabetes Mother    Diabetes Brother    CAD Neg Hx        neg for premature CAD    Social History Social History   Tobacco Use   Smoking status: Never   Smokeless tobacco: Never  Vaping Use   Vaping status: Never Used  Substance Use Topics   Alcohol use: Not Currently   Drug use: Never     Allergies   Crestor  [rosuvastatin ]   Review of Systems Review of Systems  Constitutional:  Negative for chills and fever.  HENT:  Positive for sore throat.  Negative for rhinorrhea.   Eyes:  Positive for pain, discharge and redness. Negative for photophobia, itching and visual disturbance.  Neurological:  Negative for dizziness and headaches.     Physical Exam Triage Vital Signs ED Triage Vitals  Encounter Vitals Group     BP 02/21/23 0943 118/77     Systolic BP Percentile --      Diastolic BP Percentile --      Pulse Rate 02/21/23 0943 75     Resp 02/21/23 0943 18     Temp 02/21/23 0943 97.7 F (36.5 C)     Temp Source 02/21/23 0943 Oral     SpO2 02/21/23 0943 98 %     Weight --      Height --      Head Circumference --      Peak Flow --      Pain Score 02/21/23 0948 3     Pain Loc --      Pain Education --      Exclude from Growth Chart --    No data found.  Updated Vital Signs BP 118/77 (BP Location: Right Arm)   Pulse 75   Temp 97.7 F (36.5 C) (Oral)   Resp 18   SpO2 98%   Visual Acuity Right Eye Distance: 20/50 (pt reports wearing readers here and there. Denies wearing glasses or contacts daily. For visual acuity exam, pt is not wearing glasses or contacts.) Left Eye Distance: 20/40 (pt reports wearing readers here and there. Denies wearing glasses or contacts daily. For visual acuity exam, pt is not wearing glasses or contacts.) Bilateral Distance: 20/25 (pt reports wearing readers here and there. Denies wearing glasses or contacts daily. For visual acuity exam, pt is not wearing glasses or contacts.)  Right Eye Near:   Left Eye Near:    Bilateral Near:     Physical Exam Vitals and nursing note reviewed.  Constitutional:      Appearance: He is not ill-appearing or toxic-appearing.  HENT:     Head: Normocephalic and atraumatic.     Right Ear: Tympanic membrane and ear canal normal.     Left Ear: Tympanic membrane normal.     Nose: No rhinorrhea.     Mouth/Throat:     Mouth: Mucous membranes are moist.     Pharynx: Oropharynx is clear. No oropharyngeal exudate or posterior oropharyngeal erythema.   Eyes:     General: Lids are normal.     Extraocular Movements: Extraocular movements intact.     Conjunctiva/sclera:     Right eye: Right conjunctiva is injected. No chemosis, exudate or hemorrhage.    Left eye: Left conjunctiva is injected. No chemosis, exudate or hemorrhage.    Pupils: Pupils are equal, round, and reactive to light.  Right eye: No fluorescein uptake.     Left eye: No fluorescein uptake.     Funduscopic exam:    Right eye: No hemorrhage.        Left eye: No hemorrhage.  Neurological:     Mental Status: He is alert.      UC Treatments / Results  Labs (all labs ordered are listed, but only abnormal results are displayed) Labs Reviewed - No data to display  EKG   Radiology No results found.  Procedures Procedures (including critical care time)  Medications Ordered in UC Medications - No data to display  Initial Impression / Assessment and Plan / UC Course  I have reviewed the triage vital signs and the nursing notes.  Pertinent labs & imaging results that were available during my care of the patient were reviewed by me and considered in my medical decision making (see chart for details).     58 year old male bilateral conjunctivitis no improvement with erythromycin  ointment.  Discussed with patient this could be a viral etiology however will try a different antibiotic.  He was counseled to do cool compresses, follow-up with his eye doctor in 1 to 2 days Final Clinical Impressions(s) / UC Diagnoses   Final diagnoses:  Conjunctivitis of both eyes, unspecified conjunctivitis type     Discharge Instructions      Follow-up with your eye doctor soon as possible   ED Prescriptions     Medication Sig Dispense Auth. Provider   moxifloxacin  (VIGAMOX ) 0.5 % ophthalmic solution Place 1 drop into both eyes 3 (three) times daily for 7 days. 3 mL Sherrica Niehaus, PA      PDMP not reviewed this encounter.   Anaiz Qazi, [place] 02/21/23 1023

## 2023-02-21 NOTE — Discharge Instructions (Signed)
Follow-up with your eye doctor soon as possible

## 2023-02-22 ENCOUNTER — Telehealth: Payer: Self-pay | Admitting: Emergency Medicine

## 2023-02-22 ENCOUNTER — Other Ambulatory Visit (HOSPITAL_COMMUNITY): Payer: Self-pay

## 2023-02-22 MED ORDER — MOXIFLOXACIN HCL 0.5 % OP SOLN
1.0000 [drp] | Freq: Three times a day (TID) | OPHTHALMIC | 0 refills | Status: AC
  Filled 2023-02-22: qty 3, 7d supply, fill #0

## 2023-05-20 ENCOUNTER — Other Ambulatory Visit (INDEPENDENT_AMBULATORY_CARE_PROVIDER_SITE_OTHER): Payer: Self-pay

## 2023-05-20 ENCOUNTER — Other Ambulatory Visit: Payer: Self-pay

## 2023-05-20 ENCOUNTER — Ambulatory Visit: Admitting: Surgical

## 2023-05-20 ENCOUNTER — Encounter: Payer: Self-pay | Admitting: Surgical

## 2023-05-20 DIAGNOSIS — M654 Radial styloid tenosynovitis [de Quervain]: Secondary | ICD-10-CM

## 2023-05-20 DIAGNOSIS — M25532 Pain in left wrist: Secondary | ICD-10-CM

## 2023-05-20 MED ORDER — METHYLPREDNISOLONE ACETATE 40 MG/ML IJ SUSP
20.0000 mg | INTRAMUSCULAR | Status: AC | PRN
  Administered 2023-05-20: 20 mg

## 2023-05-20 MED ORDER — LIDOCAINE HCL 1 % IJ SOLN
4.0000 mL | INTRAMUSCULAR | Status: AC | PRN
  Administered 2023-05-20: 4 mL

## 2023-05-20 MED ORDER — BUPIVACAINE HCL 0.25 % IJ SOLN
0.5000 mL | INTRAMUSCULAR | Status: AC | PRN
  Administered 2023-05-20: .5 mL

## 2023-05-20 NOTE — Progress Notes (Signed)
 Office Visit Note   Patient: Spencer Thomas           Date of Birth: Jan 10, 1966           MRN: [id number] Visit Date: 05/20/2023 Requested by: de Thomas, Spencer J, MD [address] [place],  [place] [address] PCP: de Thomas, Spencer J, MD  Subjective: Chief Complaint  Patient presents with   Left Wrist - Pain    HPI: Spencer Thomas is a 58 y.o. male who presents to the office reporting left thumb/wrist pain.  Pain has been ongoing for several months at a fairly mild level but about 2 weeks ago average pain went from 2/10 to 8/10 with no history of injury.  He has difficulty with pretty much anything he tries to do involving his left thumb at this point.  Cannot twist a bottle top off or open a door with his left hand.  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  He is right-hand dominant.  He has had to stop lifting weights due to the pain.  He is now just walking for cardio exercise.  No history of prior surgery aside from prior ganglion cyst excision.  Has tried Aleve, Voltaren gel, warm Epsom salt bath with some relief of his pain.  Also uses a thumb spica brace..  No fevers or chills.  No history of gout.              ROS: All systems reviewed are negative as they relate to the chief complaint within the history of present illness.  Patient denies fevers or chills.  Assessment & Plan: Visit Diagnoses:  1. De Quervain's tenosynovitis, left   2. Pain in left wrist     Plan: Patient is a 58 year old male who presents for evaluation of left wrist/thumb pain.  He has radial sided left wrist pain that is reproduced primarily with thumb range of motion and with things involving pinch gripping.  He has pain localizing to the first dorsal compartment with positive Finkelstein's sign on exam today.  Ultrasound examination demonstrates hypoechoic fluid in the flexor tendon sheaths of APL and EPB distally.  We discussed options available to patient and he  would like to try cortisone injection.  Under ultrasound guidance, cortisone injection successfully administered into the first dorsal compartment and patient tolerated procedure well without complication.  There was no significant resistance during administration of the injection.  During the anesthetic portion of the injection, patient had 100% relief of his symptoms with any passive and active thumb/wrist range of motion.  We will see how this does for Spencer Thomas and he will let me know if he has any recurrence of symptoms.  Follow-Up Instructions: No follow-ups on file.   Orders:  Orders Placed This Encounter  Procedures   XR Wrist Complete Left   US Guided Needle Placement - No Linked Charges   No orders of the defined types were placed in this encounter.     Procedures: Hand/UE Inj: L extensor compartment 1 for de Quervain's tenosynovitis on 05/20/2023 2:39 PM Indications: therapeutic Details: 25 G needle, ultrasound-guided radial approach Medications: 4 mL lidocaine 1 %; 0.5 mL bupivacaine 0.25 %; 20 mg methylPREDNISolone acetate 40 MG/ML Outcome: tolerated well, no immediate complications Procedure, treatment alternatives, risks and benefits explained, specific risks discussed. Consent was given by the patient. Immediately prior to procedure a time out was called to verify the correct patient,  procedure, equipment, support staff and site/side marked as required. Patient was prepped and draped in the usual sterile fashion.       Clinical Data: No additional findings.  Objective: Vital Signs: There were no vitals taken for this visit.  Physical Exam:  Constitutional: Patient appears well-developed HEENT:  Head: Normocephalic Eyes:EOM are normal Neck: Normal range of motion Cardiovascular: Normal rate Pulmonary/chest: Effort normal Neurologic: Patient is alert Skin: Skin is warm Psychiatric: Patient has normal mood and affect  Ortho Exam: Ortho exam demonstrates left upper  extremity with palpable radial pulse rated 2+.  Intact EPL, FPL, finger abduction, pronation/supination.  He has no significant tenderness over the scaphoid tubercle, 3-4 portal, 4-5 portal of the radiocarpal joint.  He has no tenderness over the ulnar fovea.  No significant cellulitis or swelling is notable.  He does have tenderness around the radial styloid in line with the first dorsal compartment.  Negative grind test.  Positive Finkelstein's test.  Specialty Comments:  No specialty comments available.  Imaging: No results found.   PMFS History: Patient Active Problem List   Diagnosis Date Noted   Wellness examination 09/17/2021   Mass of testicle 08/06/2021   Obstructive sleep apnea 08/06/2021   Adverse reaction to statin medication 09/13/2020   Sinus pause 06/26/2020   Snoring 06/26/2020   Angina at rest [facility])    SVT (supraventricular tachycardia) (HCC)    Chest pain 03/01/2020   Hyperlipidemia 12/20/2019   Cardiac arrhythmia 12/20/2019   Palpitations 12/20/2019   Irregular heart rate 11/15/2019   Severe obesity (BMI 35.0-39.9) with comorbidity (HCC) 10/06/2018   Past Medical History:  Diagnosis Date   Bradycardia    nocturnal   CKD (chronic kidney disease), stage II    HTN (hypertension)    Hyperlipemia    PSVT (paroxysmal supraventricular tachycardia) (HCC)     Family History  Problem Relation Age of Onset   Diabetes Mother    Diabetes Brother    CAD Neg Hx        neg for premature CAD    Past Surgical History:  Procedure Laterality Date   APPENDECTOMY     CORONARY ANGIOGRAPHY N/A 03/04/2020   Procedure: CORONARY ANGIOGRAPHY (CATH LAB);  Surgeon: Runell Gess, MD;  Location: [facility] INVASIVE CV LAB;  Service: Cardiovascular;  Laterality: N/A;   SVT ABLATION N/A 06/20/2020   Procedure: SVT ABLATION;  Surgeon: Hillis Range, MD;  Location: [facility] INVASIVE CV LAB;  Service: Cardiovascular;  Laterality: N/A;   Social History   Occupational History   Not on file   Tobacco Use   Smoking status: Never   Smokeless tobacco: Never  Vaping Use   Vaping status: Never Used  Substance and Sexual Activity   Alcohol use: Not Currently   Drug use: Never   Sexual activity: Not on file

## 2023-09-14 ENCOUNTER — Encounter (HOSPITAL_BASED_OUTPATIENT_CLINIC_OR_DEPARTMENT_OTHER): Payer: Self-pay

## 2023-09-20 ENCOUNTER — Ambulatory Visit (HOSPITAL_BASED_OUTPATIENT_CLINIC_OR_DEPARTMENT_OTHER): Admitting: Family Medicine

## 2023-09-27 ENCOUNTER — Encounter (HOSPITAL_BASED_OUTPATIENT_CLINIC_OR_DEPARTMENT_OTHER): Payer: Self-pay | Admitting: Family Medicine

## 2023-09-27 ENCOUNTER — Ambulatory Visit (INDEPENDENT_AMBULATORY_CARE_PROVIDER_SITE_OTHER): Payer: Commercial Managed Care - PPO | Admitting: Family Medicine

## 2023-09-27 VITALS — BP 126/87 | HR 72 | Ht 70.5 in | Wt 280.2 lb

## 2023-09-27 DIAGNOSIS — Z23 Encounter for immunization: Secondary | ICD-10-CM

## 2023-09-27 DIAGNOSIS — Z0000 Encounter for general adult medical examination without abnormal findings: Secondary | ICD-10-CM

## 2023-09-27 DIAGNOSIS — Z125 Encounter for screening for malignant neoplasm of prostate: Secondary | ICD-10-CM | POA: Diagnosis not present

## 2023-09-27 DIAGNOSIS — R052 Subacute cough: Secondary | ICD-10-CM | POA: Diagnosis not present

## 2023-09-27 DIAGNOSIS — R059 Cough, unspecified: Secondary | ICD-10-CM | POA: Insufficient documentation

## 2023-09-27 MED ORDER — BENZONATATE 200 MG PO CAPS: 200.0000 mg | ORAL_CAPSULE | Freq: Three times a day (TID) | ORAL | 0 refills | Status: AC | PRN

## 2023-09-27 NOTE — Patient Instructions (Signed)
   Medication Instructions:  Your physician recommends that you continue on your current medications as directed. Please refer to the Current Medication list given to you today. --If you need a refill on any your medications before your next appointment, please call your pharmacy first. If no refills are authorized on file call the office.-- Lab Work: Your physician has recommended that you have lab work today: today If you have labs (blood work) drawn today and your tests are completely normal, you will receive your results via MyChart message OR a phone call from our staff.  Please ensure you check your voicemail in the event that you authorized detailed messages to be left on a delegated number. If you have any lab test that is abnormal or we need to change your treatment, we will call you to review the results.    Follow-Up: Your next appointment:   Your physician recommends that you schedule a follow-up appointment in: 1 year physical with Dr. de [place]  You will receive a text message or e-mail with a link to a survey about your care and experience with [place] today! We would greatly appreciate your feedback!   Thanks for letting us be apart of your health journey!!  Primary Care and Sports Medicine   Dr. Ceasar Mons [place]   We encourage you to activate your patient portal called "MyChart".  Sign up information is provided on this After Visit Summary.  MyChart is used to connect with patients for Virtual Visits (Telemedicine).  Patients are able to view lab/test results, encounter notes, upcoming appointments, etc.  Non-urgent messages can be sent to your provider as well. To learn more about what you can do with MyChart, please visit --  [email or web address].

## 2023-09-27 NOTE — Progress Notes (Signed)
 Subjective:    CC: Annual Physical Exam  HPI: Spencer Thomas is a 58 y.o. presenting for annual physical  I reviewed the past medical history, family history, social history, surgical history, and allergies today and no changes were needed.  Please see the problem list section below in epic for further details.  Past Medical History: Past Medical History:  Diagnosis Date   Allergy 09/2006   seasonal   Bradycardia    nocturnal   Cataract 03/2021   Very small   CKD (chronic kidney disease), stage II    GERD (gastroesophageal reflux disease)    HTN (hypertension)    Hyperlipemia    PSVT (paroxysmal supraventricular tachycardia) (HCC)    Sleep apnea    Past Surgical History: Past Surgical History:  Procedure Laterality Date   APPENDECTOMY     CORONARY ANGIOGRAPHY N/A 03/04/2020   Procedure: CORONARY ANGIOGRAPHY (CATH LAB);  Surgeon: Court Dorn PARAS, MD;  Location: [facility] INVASIVE CV LAB;  Service: Cardiovascular;  Laterality: N/A;   SVT ABLATION N/A 06/20/2020   Procedure: SVT ABLATION;  Surgeon: Kelsie Agent, MD;  Location: [facility] INVASIVE CV LAB;  Service: Cardiovascular;  Laterality: N/A;   Social History: Social History   Socioeconomic History   Marital status: Single    Spouse name: Not on file   Number of children: Not on file   Years of education: Not on file   Highest education level: Associate degree: occupational, Scientist, product/process development, or vocational program  Occupational History   Not on file  Tobacco Use   Smoking status: Never    Passive exposure: Never   Smokeless tobacco: Never   Tobacco comments:    never smoked  Vaping Use   Vaping status: Never Used  Substance and Sexual Activity   Alcohol use: Not Currently   Drug use: Never   Sexual activity: Yes    Birth control/protection: Condom  Other Topics Concern   Not on file  Social History Narrative   Not on file   Social Drivers of Health   Financial Resource Strain: Low Risk  (09/20/2023)   Overall Financial  Resource Strain (CARDIA)    Difficulty of Paying Living Expenses: Not very hard  Food Insecurity: No Food Insecurity (09/20/2023)   Hunger Vital Sign    Worried About Running Out of Food in the Last Year: Never true    Ran Out of Food in the Last Year: Never true  Transportation Needs: No Transportation Needs (09/20/2023)   PRAPARE - Administrator, Civil Service (Medical): No    Lack of Transportation (Non-Medical): No  Physical Activity: Sufficiently Active (09/20/2023)   Exercise Vital Sign    Days of Exercise per Week: 4 days    Minutes of Exercise per Session: 60 min  Stress: No Stress Concern Present (09/20/2023)   [employer] of Occupational Health - Occupational Stress Questionnaire    Feeling of Stress: Only a little  Social Connections: Moderately Isolated (09/20/2023)   Social Connection and Isolation Panel    Frequency of Communication with Friends and Family: More than three times a week    Frequency of Social Gatherings with Friends and Family: Once a week    Attends Religious Services: More than 4 times per year    Active Member of [employer] or Organizations: No    Attends Engineer, structural: Not on file    Marital Status: Divorced   Family History: Family History  Problem Relation Age of Onset   Diabetes Mother  Asthma Mother    Diabetes Brother    CAD Neg Hx        neg for premature CAD   Allergies: Allergies  Allergen Reactions   Crestor  [Rosuvastatin ] Nausea And Vomiting    Myalgia also   Medications: See med rec.  Review of Systems: No headache, visual changes, nausea, vomiting, diarrhea, constipation, dizziness, abdominal pain, skin rash, fevers, chills, night sweats, swollen lymph nodes, weight loss, chest pain, body aches, joint swelling, muscle aches, shortness of breath, mood changes, visual or auditory hallucinations.  Objective:    BP 126/87 (BP Location: Left Arm, Patient Position: Sitting, Cuff Size: Large)   Pulse 72    Ht 5' 10.5 (1.791 m)   Wt 280 lb 3.2 oz (127.1 kg)   SpO2 100%   BMI 39.64 kg/m   General: Well Developed, well nourished, and in no acute distress. Neuro: Alert and oriented x3, extra-ocular muscles intact, sensation grossly intact. Cranial nerves II through XII are intact, motor, sensory, and coordinative functions are all intact. HEENT: Normocephalic, atraumatic, pupils equal round reactive to light, neck supple, no masses, no lymphadenopathy, thyroid  nonpalpable. Oropharynx, nasopharynx, external ear canals are unremarkable. Skin: Warm and dry, no rashes noted. Cardiac: Regular rate and rhythm, no murmurs rubs or gallops. Respiratory: Clear to auscultation bilaterally. Not using accessory muscles, speaking in full sentences. Abdominal: Soft, nontender, nondistended, positive bowel sounds, no masses, no organomegaly. Musculoskeletal: Shoulder, elbow, wrist, hip, knee, ankle stable, and with full range of motion.  Impression and Recommendations:    Wellness examination Assessment & Plan: Routine HCM labs ordered. HCM reviewed/discussed. Anticipatory guidance regarding healthy weight, lifestyle and choices given. Recommend healthy diet.  Recommend approximately 150 minutes/week of moderate intensity exercise Recommend regular dental and vision exams Always use seatbelt/lap and shoulder restraints Recommend using smoke alarms and checking batteries at least twice a year Recommend using sunscreen when outside Discussed colon cancer screening recommendations, options.  Patient UTD Discussed recommendations for shingles vaccine - UTD Discussed immunization recommendations The natural history of prostate cancer and ongoing controversy regarding screening and potential treatment outcomes of prostate cancer has been discussed with the patient. The meaning of a false positive PSA and a false negative PSA has been discussed. He indicates understanding of the limitations of this screening test  and wishes to proceed with screening PSA testing.  Orders: -     CBC with Differential/Platelet -     Comprehensive metabolic panel with GFR -     Hemoglobin A1c -     Lipid panel -     PSA Total (Reflex To Free) -     TSH Rfx on Abnormal to Free T4  Prostate cancer screening -     PSA Total (Reflex To Free)  Subacute cough Assessment & Plan: Ongoing for about 3 weeks. Intermittent. Notes when at work, sporadic. Chest pain, reflux sinus issues. Denies any fevers, SOB, sore throat. Has tried Delsym. Lungs clear as above Discussed typical causes, reviewed potential viral etiology, reflux issues, sinus disease/postnasal drip.  Does not have obvious cause at this time.  Discussed general conservative measures, can utilize Tessalon  Perles, discussed use of honey.  Advised that if symptoms are persisting beyond the next 3 to 4 weeks, recommend returning to the office for further evaluation.  Consider chest x-ray at that time.   Need for viral immunization -     Pneumococcal Conjugate PCV21(Capvaxive)  Other orders -     Benzonatate ; Take 1 capsule (200 mg total) by mouth  3 (three) times daily as needed for cough.  Dispense: 45 capsule; Refill: 0  Return in about 1 year (around 09/26/2024), or if symptoms worsen or fail to improve, for CPE.   ___________________________________________ Mack Thurmon de [place], MD, ABFM, CAQSM Primary Care and Sports Medicine [facility]

## 2023-09-27 NOTE — Assessment & Plan Note (Signed)
 Ongoing for about 3 weeks. Intermittent. Notes when at work, sporadic. Chest pain, reflux sinus issues. Denies any fevers, SOB, sore throat. Has tried Delsym. Lungs clear as above Discussed typical causes, reviewed potential viral etiology, reflux issues, sinus disease/postnasal drip.  Does not have obvious cause at this time.  Discussed general conservative measures, can utilize Tessalon  Perles, discussed use of honey.  Advised that if symptoms are persisting beyond the next 3 to 4 weeks, recommend returning to the office for further evaluation.  Consider chest x-ray at that time.

## 2023-09-27 NOTE — Assessment & Plan Note (Addendum)
 Routine HCM labs ordered. HCM reviewed/discussed. Anticipatory guidance regarding healthy weight, lifestyle and choices given. Recommend healthy diet.  Recommend approximately 150 minutes/week of moderate intensity exercise Recommend regular dental and vision exams Always use seatbelt/lap and shoulder restraints Recommend using smoke alarms and checking batteries at least twice a year Recommend using sunscreen when outside Discussed colon cancer screening recommendations, options.  Patient UTD Discussed recommendations for shingles vaccine - UTD Discussed immunization recommendations The natural history of prostate cancer and ongoing controversy regarding screening and potential treatment outcomes of prostate cancer has been discussed with the patient. The meaning of a false positive PSA and a false negative PSA has been discussed. He indicates understanding of the limitations of this screening test and wishes to proceed with screening PSA testing.

## 2023-09-28 ENCOUNTER — Ambulatory Visit (HOSPITAL_BASED_OUTPATIENT_CLINIC_OR_DEPARTMENT_OTHER): Payer: Self-pay | Admitting: Family Medicine

## 2023-09-28 DIAGNOSIS — E785 Hyperlipidemia, unspecified: Secondary | ICD-10-CM

## 2023-09-28 DIAGNOSIS — R7989 Other specified abnormal findings of blood chemistry: Secondary | ICD-10-CM

## 2023-09-28 LAB — LIPID PANEL
Chol/HDL Ratio: 5.7 ratio — ABNORMAL HIGH (ref 0.0–5.0)
Cholesterol, Total: 224 mg/dL — ABNORMAL HIGH (ref 100–199)
HDL: 39 mg/dL — ABNORMAL LOW (ref >39)
LDL Chol Calc (NIH): 155 mg/dL — ABNORMAL HIGH (ref 0–99)
Triglycerides: 164 mg/dL — ABNORMAL HIGH (ref 0–149)
VLDL Cholesterol Cal: 30 mg/dL (ref 5–40)

## 2023-09-28 LAB — COMPREHENSIVE METABOLIC PANEL WITH GFR
ALT: 27 IU/L (ref 0–44)
AST: 32 IU/L (ref 0–40)
Albumin: 4.4 g/dL (ref 3.8–4.9)
Alkaline Phosphatase: 59 IU/L (ref 44–121)
BUN/Creatinine Ratio: 8 — ABNORMAL LOW (ref 9–20)
BUN: 12 mg/dL (ref 6–24)
Bilirubin Total: 0.7 mg/dL (ref 0.0–1.2)
CO2: 25 mmol/L (ref 20–29)
Calcium: 9.6 mg/dL (ref 8.7–10.2)
Chloride: 99 mmol/L (ref 96–106)
Creatinine, Ser: 1.46 mg/dL — ABNORMAL HIGH (ref 0.76–1.27)
Globulin, Total: 2.5 g/dL (ref 1.5–4.5)
Glucose: 109 mg/dL — ABNORMAL HIGH (ref 70–99)
Potassium: 3.8 mmol/L (ref 3.5–5.2)
Sodium: 142 mmol/L (ref 134–144)
Total Protein: 6.9 g/dL (ref 6.0–8.5)
eGFR: 56 mL/min/1.73 — ABNORMAL LOW (ref >59)

## 2023-09-28 LAB — HEMOGLOBIN A1C
Est. average glucose Bld gHb Est-mCnc: 111 mg/dL
Hgb A1c MFr Bld: 5.5 % (ref 4.8–5.6)

## 2023-09-28 LAB — CBC WITH DIFFERENTIAL/PLATELET
Basophils Absolute: 0.1 x10E3/uL (ref 0.0–0.2)
Basos: 1 %
EOS (ABSOLUTE): 0.2 x10E3/uL (ref 0.0–0.4)
Eos: 4 %
Hematocrit: 48.6 % (ref 37.5–51.0)
Hemoglobin: 16.1 g/dL (ref 13.0–17.7)
Immature Grans (Abs): 0 x10E3/uL (ref 0.0–0.1)
Immature Granulocytes: 0 %
Lymphocytes Absolute: 2.6 x10E3/uL (ref 0.7–3.1)
Lymphs: 41 %
MCH: 30.1 pg (ref 26.6–33.0)
MCHC: 33.1 g/dL (ref 31.5–35.7)
MCV: 91 fL (ref 79–97)
Monocytes Absolute: 0.7 x10E3/uL (ref 0.1–0.9)
Monocytes: 11 %
Neutrophils Absolute: 2.8 x10E3/uL (ref 1.4–7.0)
Neutrophils: 43 %
Platelets: 201 x10E3/uL (ref 150–450)
RBC: 5.34 x10E6/uL (ref 4.14–5.80)
RDW: 13.1 % (ref 11.6–15.4)
WBC: 6.4 x10E3/uL (ref 3.4–10.8)

## 2023-09-28 LAB — PSA TOTAL (REFLEX TO FREE): Prostate Specific Ag, Serum: 0.8 ng/mL (ref 0.0–4.0)

## 2023-09-28 LAB — TSH RFX ON ABNORMAL TO FREE T4: TSH: 3.78 u[IU]/mL (ref 0.450–4.500)

## 2023-10-01 ENCOUNTER — Encounter (HOSPITAL_BASED_OUTPATIENT_CLINIC_OR_DEPARTMENT_OTHER): Payer: Self-pay | Admitting: *Deleted

## 2023-12-08 ENCOUNTER — Ambulatory Visit: Admitting: Orthopedic Surgery

## 2023-12-08 DIAGNOSIS — M654 Radial styloid tenosynovitis [de Quervain]: Secondary | ICD-10-CM

## 2023-12-08 NOTE — Progress Notes (Signed)
 Spencer Thomas - 58 y.o. male MRN [id number]  Date of birth: [birth year]  Office Visit Note: Visit Date: 12/08/2023 PCP: de Cuba, Raymond J, MD Referred by: de Cuba, Raymond J, MD  Subjective: No chief complaint on file.  HPI: Spencer Thomas is a pleasant 58 y.o. male who presents today for evaluation of ongoing left wrist radial sided pain.  Pains been present now for multiple months, did undergo an injection to the left wrist first extensor compartment in April of this year with moderate relief, pain has since recurred.  Has also been utilizing wrist and thumb bracing as tolerated.  Occasional numbness throughout the hand, mostly painful in nature along the radial aspect of the wrist with radiation into the thumb and forearm.  He is overall healthy and active at baseline.  Pertinent ROS were reviewed with the patient and found to be negative unless otherwise specified above in HPI.   Left wrist pain +Finkelstein's Wearing brace for comfort Injection back in April of 2025- some relief but pain is back  Assessment & Plan: Visit Diagnoses:  1. De Quervain's tenosynovitis, left     Plan: Extensive discussion was had with the patient today regarding his left wrist radial sided pain.  Patient has signs and symptoms consistent with de Quervain's tenosynovitis.  We discussed the underlying etiology and pathophysiology of this condition as well as treatment modalities ranging from conservative to surgical.  From a conservative standpoint, we discussed activity modification, anti-inflammatory medication both topical and oral, bracing, therapy and cortisone injections.  From a surgical standpoint, I explained to patient that we can perform wrist first extensor compartment release should symptoms remain affected conservative care.  At this juncture, given that his symptoms are refractory to conservative care in the form of activity modification, bracing and prior injection, patient is indicated for  left wrist for extensor compartment release.  Risks and benefits of the procedure were discussed, risks including but not limited to infection, bleeding, scarring, stiffness, nerve injury, tendon injury, vascular injury, tendon instability, recurrence of symptoms and need for subsequent operation.  We also discussed the appropriate postoperative protocol and timeframe for return to activities and function.  Patient expressed understanding.  He would like to have surgery done in the office setting under local anesthesia, we will move forward with surgical scheduling.   Follow-up: No follow-ups on file.   Meds & Orders: No orders of the defined types were placed in this encounter.  No orders of the defined types were placed in this encounter.    Procedures: No procedures performed      Clinical History: No specialty comments available.  He reports that he has never smoked. He has never been exposed to tobacco smoke. He has never used smokeless tobacco.  Recent Labs    09/27/23 0907  HGBA1C 5.5    Objective:   Vital Signs: There were no vitals taken for this visit.  Physical Exam  Gen: Well-appearing, in no acute distress; non-toxic CV: Regular Rate. Well-perfused. Warm.  Resp: Breathing unlabored on room air; no wheezing. Psych: Fluid speech in conversation; appropriate affect; normal thought process  Ortho Exam General: Patient is well appearing and in no distress.  Skin and Muscle: No skin changes are apparent to upper extremities.  Muscle bulk and contour normal, no signs of atrophy.     Range of Motion and Palpation Tests: Forearm supination and pronation are 85/85 bilaterally.  Wrist flexion/extension is 75/65 right side, wrist flexion/extension limited secondary  to pain left side, approximately 45/45.  Digital flexion and extension are full.    No cords or nodules are palpated.  No triggering is observed.   Finklestein test is positive left side, significant pain with  associated swelling and tenderness.    Neurologic, Vascular, Motor: Sensation is intact to light touch in the median/radial/ulnar distributions.   Fingers pink and well perfused.  Capillary refill is brisk.      Imaging: No results found.  Past Medical/Family/Surgical/Social History: Medications & Allergies reviewed per EMR, new medications updated. Patient Active Problem List   Diagnosis Date Noted   Cough 09/27/2023   Wellness examination 09/17/2021   Mass of testicle 08/06/2021   Obstructive sleep apnea 08/06/2021   Adverse reaction to statin medication 09/13/2020   Sinus pause 06/26/2020   Snoring 06/26/2020   Angina at rest    SVT (supraventricular tachycardia)    Chest pain 03/01/2020   Hyperlipidemia 12/20/2019   Cardiac arrhythmia 12/20/2019   Palpitations 12/20/2019   Blood pressure elevated without history of HTN 12/20/2019   Irregular heart rate 11/15/2019   Severe obesity (BMI 35.0-39.9) with comorbidity (HCC) 10/06/2018   Vitamin D  deficiency 07/10/2014   Chronic low back pain without sciatica 07/06/2014   Past Medical History:  Diagnosis Date   Allergy 09/2006   seasonal   Bradycardia    nocturnal   Cataract 03/2021   Very small   CKD (chronic kidney disease), stage II    GERD (gastroesophageal reflux disease)    HTN (hypertension)    Hyperlipemia    PSVT (paroxysmal supraventricular tachycardia)    Sleep apnea    Family History  Problem Relation Age of Onset   Diabetes Mother    Asthma Mother    Diabetes Brother    CAD Neg Hx        neg for premature CAD   Past Surgical History:  Procedure Laterality Date   APPENDECTOMY     CORONARY ANGIOGRAPHY N/A 03/04/2020   Procedure: CORONARY ANGIOGRAPHY (CATH LAB);  Surgeon: Court Dorn PARAS, MD;  Location: [facility] INVASIVE CV LAB;  Service: Cardiovascular;  Laterality: N/A;   SVT ABLATION N/A 06/20/2020   Procedure: SVT ABLATION;  Surgeon: Kelsie Agent, MD;  Location: [facility] INVASIVE CV LAB;  Service:  Cardiovascular;  Laterality: N/A;   Social History   Occupational History   Not on file  Tobacco Use   Smoking status: Never    Passive exposure: Never   Smokeless tobacco: Never   Tobacco comments:    never smoked  Vaping Use   Vaping status: Never Used  Substance and Sexual Activity   Alcohol use: Not Currently   Drug use: Never   Sexual activity: Yes    Birth control/protection: Condom    Kimberle Stanfill Estela) Arlinda, M.D. [facility], Hand Surgery

## 2023-12-12 ENCOUNTER — Other Ambulatory Visit: Payer: Self-pay | Admitting: Cardiology

## 2023-12-12 DIAGNOSIS — E785 Hyperlipidemia, unspecified: Secondary | ICD-10-CM

## 2023-12-12 DIAGNOSIS — I2089 Other forms of angina pectoris: Secondary | ICD-10-CM

## 2023-12-13 ENCOUNTER — Encounter: Payer: Self-pay | Admitting: Radiology

## 2023-12-13 ENCOUNTER — Telehealth: Payer: Self-pay

## 2023-12-13 NOTE — Telephone Encounter (Signed)
 Patient needing additional information regarding the procedure he is scheduled for on 11/11. Estimated time out of work, process, catering manager

## 2023-12-14 ENCOUNTER — Other Ambulatory Visit: Payer: Self-pay

## 2023-12-14 ENCOUNTER — Other Ambulatory Visit (HOSPITAL_COMMUNITY): Payer: Self-pay

## 2023-12-14 ENCOUNTER — Other Ambulatory Visit: Payer: Self-pay | Admitting: Cardiology

## 2023-12-14 DIAGNOSIS — I2089 Other forms of angina pectoris: Secondary | ICD-10-CM

## 2023-12-14 DIAGNOSIS — M654 Radial styloid tenosynovitis [de Quervain]: Secondary | ICD-10-CM

## 2023-12-14 DIAGNOSIS — E785 Hyperlipidemia, unspecified: Secondary | ICD-10-CM

## 2023-12-14 MED ORDER — METOPROLOL SUCCINATE ER 25 MG PO TB24
25.0000 mg | ORAL_TABLET | Freq: Every day | ORAL | 0 refills | Status: AC
  Filled 2023-12-14: qty 30, 30d supply, fill #0

## 2023-12-14 NOTE — Telephone Encounter (Signed)
 Pt of Dr. Shlomo. Does Dr. Shlomo want to refill this RX? Please advise.

## 2023-12-15 ENCOUNTER — Encounter (HOSPITAL_COMMUNITY): Payer: Self-pay

## 2023-12-15 ENCOUNTER — Other Ambulatory Visit (HOSPITAL_COMMUNITY): Payer: Self-pay

## 2023-12-15 NOTE — Telephone Encounter (Signed)
 Spoke to patient in clinic today; answered all questions

## 2023-12-21 ENCOUNTER — Other Ambulatory Visit: Payer: Self-pay | Admitting: Orthopedic Surgery

## 2023-12-21 ENCOUNTER — Ambulatory Visit: Admitting: Orthopedic Surgery

## 2023-12-21 DIAGNOSIS — M654 Radial styloid tenosynovitis [de Quervain]: Secondary | ICD-10-CM | POA: Diagnosis not present

## 2023-12-21 MED ORDER — ACETAMINOPHEN-CODEINE 300-30 MG PO TABS: 1.0000 | ORAL_TABLET | Freq: Four times a day (QID) | ORAL | 0 refills | Status: AC | PRN

## 2023-12-21 NOTE — Progress Notes (Addendum)
 Procedure Note  Patient: Spencer Thomas             Date of Birth: 04-Apr-1965           MRN: [id number]             Visit Date: 12/21/2023  Procedures: Visit Diagnoses:  1. De Quervain's tenosynovitis, left     NAME: Spencer Thomas MEDICAL RECORD NO: [id number] DATE OF BIRTH: May 06, 1965 [facility] LOCATION[facility] PHYSICIAN: GILDARDO ALDERTON, MD   OPERATIVE REPORT   DATE OF PROCEDURE: 12/21/23    PREOPERATIVE DIAGNOSIS: Left wrist de Quervain's tenosynovitis   POSTOPERATIVE DIAGNOSIS: Left wrist de Quervain's tenosynovitis   PROCEDURE: Left wrist first extensor compartment release with extensor tenosynovectomy   SURGEON:  Gildardo Alderton, M.D.   ASSISTANT: Almeda Rummer, PA   ANESTHESIA:  Local   INTRAVENOUS FLUIDS:  Per anesthesia flow sheet.   ESTIMATED BLOOD LOSS:  Minimal.   COMPLICATIONS:  None.   SPECIMENS:  none   TOURNIQUET TIME: 9 minutes  DISPOSITION:  Stable to PACU.   INDICATIONS: 58 year old male who was seen in the outpatient setting and found to have signs and symptoms consistent with left wrist de Quervain's tenosynovitis that was refractory to conservative care.  Patient was indicated for left wrist first extensor compartment release with associated tenosynovectomy.  Risks and benefits of surgery were discussed including the risks of infection, bleeding, scarring, stiffness, nerve injury, vascular injury, tendon injury, need for subsequent operation, tendon instability, recurrence.  He voiced understanding of these risks and elected to proceed.  OPERATIVE COURSE: Patient was seen and identified in the preoperative area and marked appropriately.  Surgical consent had been signed. He was transferred to the procedure room and placed in supine position with the left upper extremity on an arm board.  20 cc of 1% lidocaine  with epinephrine was utilized around the planned incisional site.  Left upper extremity was prepped and draped in  normal sterile orthopedic fashion.  A surgical pause was performed between the surgeon and staff and all were in agreement as to the patient, procedure, and site of procedure.  Tourniquet was placed and padded appropriately to the left upper arm.  The arm was exsanguinated the tourniquet was inflated to 250 mmHg.  A 3 cm incision was designed distal to the radial styloid at the glabrous/non glabrous border of the dorsal radial wrist.  Crossing branch of the radial sensory nerve were identified and carefully protected.  The first extensor compartment was identified.  This was sharply incised along its dorsal most aspect.  The extensor pollicis brevis was identified and released.  The subsheath to the abductor pollicis longus was identified and sharply divided as well.   Following tendon sheath incision over the radial styloid, extensor tenosynovectomy was completed.  Significant amount of inflammatory tissue was encountered, identified and sharply excised.  A tenolysis was performed for both the extensor pollicis brevis and abductor pollicis longus tendons as they were adherent to each other.  Smooth gliding to the tendon services was noted following extensor tenolysis.  There was no evidence of volar subluxation of the tendons with wrist circumduction.  The tourniquet was deflated and bipolar electrocautery was utilized for hemostasis.  Tourniquet time was 9 minutes.  Wound was closed in layers utilizing 3-0 Monocryl for the subcutaneous tissue and a running 4-0 Monocryl stitch for the skin surface.  Sterile dressings were applied followed by application of a thumb spica splint utilizing plaster.  Fingertips were pink  with brisk capillary refill after deflation of tourniquet.  The operative drapes were broken down.  The patient was taken to the recovery area in stable condition.  Post-operative plan: The patient will be discharged home.  The patient will be non weight bearing on the left upper extremity in a  thumb spica splint.   I will see the patient back in the office in 2 weeks for postoperative followup.  Discharge instructions were provided for appropriate dressing maintenance and pain control.  Kaiyana Bedore, MD Electronically signed, 12/21/23

## 2023-12-24 ENCOUNTER — Other Ambulatory Visit (HOSPITAL_COMMUNITY): Payer: Self-pay

## 2024-01-03 NOTE — Therapy (Signed)
 OUTPATIENT OCCUPATIONAL THERAPY ORTHO EVALUATION  Patient Name: Spencer Thomas MRN: [id number] DOB:1965/07/15, 58 y.o., male Today's Date: 01/04/2024  PCP: de Cuba, FABIENE MD  REFERRING PROVIDER: Dr Arlinda  END OF SESSION:  OT End of Session - 01/04/24 0850     Visit Number 1    Number of Visits 5    Date for Recertification  02/18/24    Authorization Type [employer]    OT Start Time [phone]    OT Stop Time [phone]    OT Time Calculation (min) 48 min    Equipment Utilized During Treatment orthotic materials    Activity Tolerance Patient limited by pain;No increased pain;Patient limited by fatigue;Patient tolerated treatment well    Behavior During Therapy [facility] for tasks assessed/performed          Past Medical History:  Diagnosis Date   Allergy 09/2006   seasonal   Bradycardia    nocturnal   Cataract 03/2021   Very small   CKD (chronic kidney disease), stage II    GERD (gastroesophageal reflux disease)    HTN (hypertension)    Hyperlipemia    PSVT (paroxysmal supraventricular tachycardia)    Sleep apnea    Past Surgical History:  Procedure Laterality Date   APPENDECTOMY     CORONARY ANGIOGRAPHY N/A 03/04/2020   Procedure: CORONARY ANGIOGRAPHY (CATH LAB);  Surgeon: Court Dorn PARAS, MD;  Location: [facility] INVASIVE CV LAB;  Service: Cardiovascular;  Laterality: N/A;   SVT ABLATION N/A 06/20/2020   Procedure: SVT ABLATION;  Surgeon: Kelsie Agent, MD;  Location: [facility] INVASIVE CV LAB;  Service: Cardiovascular;  Laterality: N/A;   Patient Active Problem List   Diagnosis Date Noted   Cough 09/27/2023   Wellness examination 09/17/2021   Mass of testicle 08/06/2021   Obstructive sleep apnea 08/06/2021   Adverse reaction to statin medication 09/13/2020   Sinus pause 06/26/2020   Snoring 06/26/2020   Angina at rest    SVT (supraventricular tachycardia)    Chest pain 03/01/2020   Hyperlipidemia 12/20/2019   Cardiac arrhythmia 12/20/2019   Palpitations 12/20/2019   Blood pressure  elevated without history of HTN 12/20/2019   Irregular heart rate 11/15/2019   Severe obesity (BMI 35.0-39.9) with comorbidity (HCC) 10/06/2018   Vitamin D  deficiency 07/10/2014   Chronic low back pain without sciatica 07/06/2014    ONSET DATE: DO 12/21/23  REFERRING DIAG: M65.4 (ICD-10-CM) - De Quervain's tenosynovitis, left   THERAPY DIAG:  Muscle weakness (generalized) - Plan: Ot plan of care cert/re-cert  Localized edema - Plan: Ot plan of care cert/re-cert  Other lack of coordination - Plan: Ot plan of care cert/re-cert  Rationale for Evaluation and Treatment: Rehabilitation  SUBJECTIVE:   SUBJECTIVE STATEMENT: He is now ~2 weeks s/p Lt DQR.  He states he had pain for almost a year or more in the left wrist, he also states a history of paresthesia in digits 3 through 5 at times.  He does state getting a new pillow and trying to sleep differently. Feeling pretty good after sutures out and cast off.     PERTINENT HISTORY: Over a year of pain in the left wrist  PRECAUTIONS: None  RED FLAGS: None   WEIGHT BEARING RESTRICTIONS: Yes no significant weightbearing recommended for the next 4 to 6 weeks, keep it under 5 to 10 pounds or as tolerated  PAIN:  Are you having pain? Yes: NPRS scale: 1-2/10 at rest Pain location: Lt wrist sx area  Pain description: aching Aggravating factors:  movement/weight bearing Relieving factors: rest, heat   FALLS: Has patient fallen in last 6 months? No  PLOF: Independent  PATIENT GOALS: To safely improve the use of his left nondominant hand and arm postop  NEXT MD VISIT: Approximately 4 weeks   OBJECTIVE: (All objective assessments below are from initial evaluation on: 01/04/24 unless otherwise specified.)    HAND DOMINANCE: Right   ADLs: Overall ADLs: States decreased ability to grab, hold household objects, pain and difficulty to open containers, perform FMS tasks (manipulate fasteners on clothing).     FUNCTIONAL OUTCOME  MEASURES: Eval: Patient Specific Functional Scale: 3 (Lifting, pulling, pushing)  (Higher Score  =  Better Ability for the Selected Tasks)       UPPER EXTREMITY ROM     Shoulder to Wrist AROM Left eval  Wrist flexion ~30  Wrist extension ~30  (Blank rows = not tested)   Hand AROM Left eval  Full Fist Ability (or Gap to Distal Palmar Crease) Loose but full fist able today  Thumb Opposition  (Kapandji Scale)  4/10  Thumb MCP (0-60)   Thumb IP (0-80)   Thumb Radial Abduction Span    Thumb Palmar Abduction Span    (Blank rows = not tested)   UPPER EXTREMITY MMT:    Eval:  NT at eval due to recent and still healing injuries. Expected to improve with HEP and recommendations.   HAND FUNCTION: Eval: Observed weakness in affected Lt hand/arm, grossly 3-/5 MMT, but specific gripping and resistance training contraindicated today. Expected to improve with HEP and recommendations.   COORDINATION: Eval: Mild observed coordination impairments with affected Lt hand/arm. Expected to improve with HEP and recommendations.   SENSATION: Eval:  Light touch intact today, though diminished around sx area. Expected to improve with HEP and recommendations.   EDEMA:   Eval:  Mildly swollen in Lt hand today.  Expected to improve with HEP and recommendations.   COGNITION: Eval: Overall cognitive status: WFL for evaluation today, a bit anxious  OBSERVATIONS:   Eval: Surgical site is clean and no overt signs of infection, no drainage, signs of dehiscence, etc.  Tenderness and swelling is within normal limits for post-op timeframe.     TODAY'S TREATMENT:  Post-evaluation treatment:   Custom orthotic fabrication was indicated due to pt's healing left wrist first dorsal compartment release surgery and need for safe, functional positioning. OT fabricated custom forearm-based thumb spica orthosis for pt today to immobilize the wrist and base of the thumb for rest as needed. It fit well with no areas  of pressure, pt states a comfortable fit. Pt was educated on the wearing schedule (at night, with activity PRN), to avoid exposing it to sources of heat, to wipe clean as needed (do not wash, use harsh detergents), to call or come in ASAP if it is causing any irritation or is not achieving desired function. It will be checked/adjusted in upcoming sessions, as needed. Pt states understanding all directions.    The patient was given safety information for managing post-op wound, including not to soak wound, to keep clean and dry, to start with scar mobilizations if the wound is closed. He was also supplied with compressive gauze to help with swelling as needed.  They should contact the surgeon with any concerns immediately.   The patient should also avoid any strong gripping, push, pull, weight bearing or repetitive motion for the next month.  They should not be doing painful activities.   After a month, they  can progressively return to all light, normal activities. Sports and heavy weight lifting  should be withheld for a total of 3 months.   The patient was also educated (explanation and demonstration) on the following home exercise program including tolerable range of motion, gentle passive range of motion, scar care, progressive desensitization, prevention of soft tissue contractures, etc. The patient states understanding all directions and will follow up as needed.   Exercises - Standing Radial Nerve Glide  - 4-6 x daily - 1 sets - 10-15 reps - Ulnar Nerve Flossing  - 3-4 x daily - 1-2 sets - 5-10 reps - Turn Palm Facing Up & Down  - 4-6 x daily - 10-15 reps - Forearm Supination Stretch  - 3-4 x daily - 3-5 reps - 15 sec hold - Forearm Pronation Stretch  - 3-4 x daily - 3-5 reps - 15 sec hold - Bend and Pull Back Wrist SLOWLY  - 4 x daily - 10-15 reps - Windshield Wipers   - 4 x daily - 10-15 reps - Wrist Flexion Stretch  - 4 x daily - 3-5 reps - 15 sec hold - Wrist Prayer Stretch  - 4 x daily  - 3-5 reps - 15 sec hold - Tendon Glides  - 4-6 x daily - 3-5 reps - 2-3 seconds hold - Thumb Opposition  - 4-6 x daily - 10 reps - Stretch thumb toward base of small finger (put hand in LAP)  - 2-3 x daily - 3-5 reps - 15 sec hold    PATIENT EDUCATION: Education details: See tx section above for details  Person educated: Patient Education method: Verbal Instruction, Teach back, Handouts  Education comprehension: States and demonstrates understanding   HOME EXERCISE PROGRAM: Access Code: [email or web address] URL: [email or web address] Date: 01/04/2024 Prepared by: Melvenia Ada   GOALS: Goals reviewed with patient? Yes   SHORT TERM GOALS: (STG required if POC>30 days) Target Date: 01/04/24  1.  Pt will demo/state understanding of initial HEP and therapist recommendations to improve pain levels, improve motions and ability and eventually return to normal activities.   Goal status: MET  2. Pt will obtain protective, custom orthotic to prevent injury to surgical site and prevent soft-tissue contractures.  Goal status: MET     ASSESSMENT:  CLINICAL IMPRESSION: Patient is a 58 y.o. male who was seen today for occupational therapy evaluation for swelling, pain, weakness and decreased functional ability following Lt de Quervain's release procedure. The patient is appropriate for OT rehab services and benefited from treatment today. The patient got copious education/treatment today for self-care, wound management, exercises and how to transition to normal activities in the next 4-6 weeks. The patient is a bit apprehensive that he can manage these recommendations independently, so he was recommended to follow-up as needed over the next 6 weeks to help him through the process. The patient should follow up with the surgeon with any concerns.      PERFORMANCE DEFICITS: in functional skills including ADLs, IADLs, coordination, dexterity, sensation, edema, ROM, strength, pain, fascial  restrictions, flexibility, Fine motor control, body mechanics, endurance, decreased knowledge of precautions, wound, and UE functional use, cognitive skills including problem solving and safety awareness, and psychosocial skills including coping strategies, environmental adaptation, and habits.   IMPAIRMENTS: are limiting patient from ADLs, IADLs, rest and sleep, leisure, and social participation.   COMORBIDITIES: may have co-morbidities  that affects occupational performance. Patient will benefit from skilled OT to address above impairments and improve overall function.  MODIFICATION OR  ASSISTANCE TO COMPLETE EVALUATION: No modification of tasks or assist necessary to complete an evaluation.  OT OCCUPATIONAL PROFILE AND HISTORY: Problem focused assessment: Including review of records relating to presenting problem.  CLINICAL DECISION MAKING: LOW - limited treatment options, no task modification necessary  REHAB POTENTIAL: Excellent  EVALUATION COMPLEXITY: Low      PLAN:  OT FREQUENCY & OT DURATION: Up to 1 time a week visits for 6 weeks as needed through 02/18/2024 and up to 5 total visits  PLANNED INTERVENTIONS: self care/ADL training, therapeutic exercise, therapeutic activity, neuromuscular re-education, manual therapy, scar mobilization, passive range of motion, splinting, ultrasound, fluidotherapy, compression bandaging, moist heat, cryotherapy, contrast bath, patient/family education, energy conservation, coping strategies training, and Re-evaluation  RECOMMENDED OTHER SERVICES: none now   CONSULTED AND AGREED WITH PLAN OF CARE: Patient  PLAN FOR NEXT SESSION:   F/u as needed over next 6 weeks    Arantxa Piercey, OTR/L, CHT 01/04/2024, 11:12 AM

## 2024-01-03 NOTE — Progress Notes (Unsigned)
   DAESHAUN SPECHT - 58 y.o. male MRN [id number]  Date of birth: 01/28/1966  Office Visit Note: Visit Date: 01/04/2024 PCP: de [place], Raymond J, MD Referred by: de [place], Raymond J, MD  Subjective:  HPI: RANVEER WAHLSTROM is a 58 y.o. male who presents today for follow up 2 weeks status post right wrist first extensor compartment release.  Pertinent ROS were reviewed with the patient and found to be negative unless otherwise specified above in HPI.   Assessment & Plan: Visit Diagnoses: No diagnosis found.  Plan: ***  Follow-up: No follow-ups on file.   Meds & Orders: No orders of the defined types were placed in this encounter.  No orders of the defined types were placed in this encounter.    Procedures: No procedures performed       Objective:   Vital Signs: There were no vitals taken for this visit.  Ortho Exam ***  Imaging: No results found.   Jovon Winterhalter Afton Alderton, M.D. [facility], Hand Surgery

## 2024-01-04 ENCOUNTER — Encounter: Payer: Self-pay | Admitting: Rehabilitative and Restorative Service Providers"

## 2024-01-04 ENCOUNTER — Ambulatory Visit: Admitting: Orthopedic Surgery

## 2024-01-04 ENCOUNTER — Ambulatory Visit: Admitting: Rehabilitative and Restorative Service Providers"

## 2024-01-04 DIAGNOSIS — M6281 Muscle weakness (generalized): Secondary | ICD-10-CM

## 2024-01-04 DIAGNOSIS — M25531 Pain in right wrist: Secondary | ICD-10-CM | POA: Diagnosis not present

## 2024-01-04 DIAGNOSIS — R278 Other lack of coordination: Secondary | ICD-10-CM

## 2024-01-04 DIAGNOSIS — R600 Localized edema: Secondary | ICD-10-CM | POA: Diagnosis not present

## 2024-01-04 DIAGNOSIS — M654 Radial styloid tenosynovitis [de Quervain]: Secondary | ICD-10-CM

## 2024-01-04 DIAGNOSIS — M25631 Stiffness of right wrist, not elsewhere classified: Secondary | ICD-10-CM

## 2024-01-04 NOTE — Patient Instructions (Signed)
 Nate's deQuervain's Release Recommendations   Do not to soak your wound for at least 1 week, or until it looks well healed. Wash hand or shower quickly, then dab dry gently. You can put a Vaseline or plain lotion on your closed wound to help the scar heal smoothly. (Don't leave it looking dry and crusty.)   Avoid any strong gripping, push, pull, weight bearing or repetitive motion for the next month.  (If it hurts very badly- don't do it!) After a month, you can progressively return to all normal, light activities. Sports and heavy weightlifting should be withheld for about 3 months.   Gently rub/touch the surface of your scar 4-6x day to desensitize it or "normalize" your sensation. Once your surgical wound is looking healed and closed (about 3 days after stitches are out) start touching/rubbing your scar trying to shift the top layer of your skin. It's ok to do this gently over top of steri-strips. Do not remove steri-strips, but wait for them to fall off naturally. Rub, touch gently for 2-3 minutes, 3-4 x day.      If you have any tingling or itching/burning around your scar, just lightly rub/brush it with a cloth for 2-3 mins to calm your nerves.  Begin with gentle motion exercises about 4 times a day, non-painfully, feeling light to medium tension.  (See exercise sheet)  Wait 2-4 weeks after surgery to use "squeeze balls" or "hand grippers"  Wait 4-6 weeks after surgery to start strengthening in your arm in a gym setting   In general, keep your hand moving, and don't let it heal stiffly.  If you're sore- slow down!   If you're stiff- speed up!

## 2024-01-18 ENCOUNTER — Ambulatory Visit: Admitting: Orthopedic Surgery

## 2024-01-18 DIAGNOSIS — M654 Radial styloid tenosynovitis [de Quervain]: Secondary | ICD-10-CM

## 2024-01-18 DIAGNOSIS — M79641 Pain in right hand: Secondary | ICD-10-CM | POA: Diagnosis not present

## 2024-01-18 NOTE — Progress Notes (Signed)
   Spencer Thomas - 58 y.o. male MRN [id number]  Date of birth: Sep 08, 1965  Office Visit Note: Visit Date: 01/18/2024 PCP: de [place], Raymond J, MD Referred by: de [place], Raymond J, MD  Subjective:  HPI: Spencer Thomas is a 58 y.o. male who presents today for follow up 4 weeks status post left wrist first extensor compartment release.  He is doing well over, pain is improving.  Is beginning to wean from the splint and resume daily activities gradually.  He is also developing some symptoms on the contralateral wrist that are consistent with de Quervain's tenosynovitis.  Comfort Cool brace was provided today, will continue to monitor  Pertinent ROS were reviewed with the patient and found to be negative unless otherwise specified above in HPI.   Assessment & Plan: Visit Diagnoses: No diagnosis found.  Plan: He is doing well postoperatively.  Continue with home exercise program as given to him by OT with gradual progression of activities.  Can return to work in roughly 2 weeks, work note was provided today to resume work as of December 28.  Follow-up: No follow-ups on file.   Meds & Orders: No orders of the defined types were placed in this encounter.  No orders of the defined types were placed in this encounter.    Procedures: No procedures performed       Objective:   Vital Signs: There were no vitals taken for this visit.  Ortho Exam Left wrist: - Well-healed incision at the glabrous/nonglabrous juncture over the radial wrist - Thumb opposition to the small finger DPC - Thumb circumduction without significant pain or crepitus - Hand is warm well-perfused, sensation intact in all distributions including DRSN   Imaging: No results found.   Spencer Thomas, M.D. [facility], Hand Surgery

## 2024-09-27 ENCOUNTER — Encounter (HOSPITAL_BASED_OUTPATIENT_CLINIC_OR_DEPARTMENT_OTHER): Admitting: Family Medicine
